# Patient Record
Sex: Male | Born: 1985 | Race: White | Hispanic: No | Marital: Single | Smoking: Current every day smoker
Health system: Southern US, Community
[De-identification: ages and names within clinical notes are randomized; demographics above are authoritative.]

## PROBLEM LIST (undated history)

## (undated) DIAGNOSIS — F1910 Other psychoactive substance abuse, uncomplicated: Secondary | ICD-10-CM

## (undated) DIAGNOSIS — H7290 Unspecified perforation of tympanic membrane, unspecified ear: Secondary | ICD-10-CM

## (undated) DIAGNOSIS — J45909 Unspecified asthma, uncomplicated: Secondary | ICD-10-CM

## (undated) HISTORY — DX: Unspecified asthma, uncomplicated: J45.909

## (undated) HISTORY — DX: Other psychoactive substance abuse, uncomplicated: F19.10

## (undated) HISTORY — PX: TONSILLECTOMY: SUR1361

---
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
## 2011-04-20 ENCOUNTER — Emergency Department (HOSPITAL_COMMUNITY)
Admission: EM | Admit: 2011-04-20 | Discharge: 2011-04-21 | Disposition: A | Discharge status: Other Acute Inpatient Hospital | Attending: Emergency Medicine | Admitting: Emergency Medicine

## 2011-04-20 ENCOUNTER — Ambulatory Visit (HOSPITAL_COMMUNITY)
Admission: RE | Admit: 2011-04-20 | Discharge: 2011-04-20 | Disposition: A | Discharge status: Home / Self Care | Source: Ambulatory Visit | Attending: Psychiatry | Admitting: Psychiatry

## 2011-04-20 DIAGNOSIS — IMO0002 Reserved for concepts with insufficient information to code with codable children: Secondary | ICD-10-CM | POA: Insufficient documentation

## 2011-04-20 DIAGNOSIS — R45850 Homicidal ideations: Secondary | ICD-10-CM | POA: Insufficient documentation

## 2011-04-20 DIAGNOSIS — F39 Unspecified mood [affective] disorder: Secondary | ICD-10-CM | POA: Insufficient documentation

## 2011-04-20 LAB — DIFFERENTIAL
Basophils Absolute: 0 10*3/uL (ref 0.0–0.1)
Basophils Relative: 0 % (ref 0–1)
Eosinophils Absolute: 0.1 10*3/uL (ref 0.0–0.7)
Eosinophils Relative: 1 % (ref 0–5)
Lymphocytes Relative: 17 % (ref 12–46)
Lymphs Abs: 1.9 10*3/uL (ref 0.7–4.0)
Monocytes Absolute: 0.8 10*3/uL (ref 0.1–1.0)
Monocytes Relative: 7 % (ref 3–12)
Neutro Abs: 8.5 10*3/uL — ABNORMAL HIGH (ref 1.7–7.7)
Neutrophils Relative %: 76 % (ref 43–77)

## 2011-04-20 LAB — RAPID URINE DRUG SCREEN, HOSP PERFORMED
Barbiturates: NOT DETECTED
Benzodiazepines: POSITIVE — AB

## 2011-04-20 LAB — COMPREHENSIVE METABOLIC PANEL
ALT: 24 U/L (ref 0–53)
Alkaline Phosphatase: 83 U/L (ref 39–117)
BUN: 8 mg/dL (ref 6–23)
CO2: 30 mEq/L (ref 19–32)
GFR calc Af Amer: >60 mL/min (ref >60)
GFR calc non Af Amer: >60 mL/min (ref >60)
Glucose, Bld: 101 mg/dL — ABNORMAL HIGH (ref 70–99)
Potassium: 4 mEq/L (ref 3.5–5.1)
Sodium: 140 mEq/L (ref 135–145)
Total Bilirubin: 0.2 mg/dL — ABNORMAL LOW (ref 0.3–1.2)
Total Protein: 7.4 g/dL (ref 6.0–8.3)

## 2011-04-20 LAB — CBC
HCT: 46.8 % (ref 39.0–52.0)
Hemoglobin: 15.5 g/dL (ref 13.0–17.0)
MCHC: 33.1 g/dL (ref 30.0–36.0)
RBC: 4.96 MIL/uL (ref 4.22–5.81)

## 2011-04-20 LAB — ETHANOL: Alcohol, Ethyl (B): <11 mg/dL (ref 0–11)

## 2016-05-19 ENCOUNTER — Encounter (HOSPITAL_COMMUNITY): Payer: Self-pay | Admitting: Emergency Medicine

## 2016-05-19 ENCOUNTER — Emergency Department (HOSPITAL_COMMUNITY)
Admission: EM | Admit: 2016-05-19 | Discharge: 2016-05-19 | Disposition: A | Discharge status: Home / Self Care | Attending: Emergency Medicine | Admitting: Emergency Medicine

## 2016-05-19 DIAGNOSIS — H6092 Unspecified otitis externa, left ear: Secondary | ICD-10-CM | POA: Insufficient documentation

## 2016-05-19 HISTORY — DX: Unspecified perforation of tympanic membrane, unspecified ear: H72.90

## 2016-05-19 MED ORDER — AMOXICILLIN 500 MG PO CAPS: 500.0000 mg | ORAL_CAPSULE | Freq: Three times a day (TID) | ORAL | 0 refills | Status: DC

## 2016-05-19 MED ORDER — NEOMYCIN-POLYMYXIN-HC 1 % OT SOLN
4.0000 [drp] | Freq: Once | OTIC | Status: AC
  Administered 2016-05-19: 4 [drp] via OTIC
  Filled 2016-05-19: qty 10

## 2016-05-19 MED ORDER — DICLOFENAC SODIUM 75 MG PO TBEC: 75.0000 mg | DELAYED_RELEASE_TABLET | Freq: Two times a day (BID) | ORAL | 0 refills | Status: DC

## 2016-05-19 MED ORDER — AMOXICILLIN 250 MG PO CAPS
500.0000 mg | ORAL_CAPSULE | Freq: Once | ORAL | Status: AC
  Administered 2016-05-19: 500 mg via ORAL
  Filled 2016-05-19: qty 2

## 2016-05-19 MED ORDER — KETOROLAC TROMETHAMINE 10 MG PO TABS
10.0000 mg | ORAL_TABLET | Freq: Once | ORAL | Status: AC
  Administered 2016-05-19: 10 mg via ORAL
  Filled 2016-05-19: qty 1

## 2016-05-19 NOTE — ED Triage Notes (Signed)
Pt reports left ear pain for the past week.  States a lot of wax buildup and decreased hearing.

## 2016-05-19 NOTE — ED Provider Notes (Signed)
[facility] DEPT Provider Note   CSN: [id number] Arrival date & time: 05/19/16  1044  By signing my name below, I, Shanna Cisco, attest that this documentation has been prepared under the direction and in the presence of Lily Kocher, PA-C. Electronically signed by: Shanna Cisco, ED Scribe. 05/19/16. 11:32 AM.  History   Chief Complaint Chief Complaint  Patient presents with  . Otalgia    left   The history is provided by the patient. No language interpreter was used.   HPI Comments:  Adrian Chandler is a 30 y.o. male who presents to the Emergency Department complaining of left ear pain, which started 5 days ago. Associated symptoms include wax buildup, drainage and decreased hearing. Pt reports that he has been cleaning ears every night with Q-tips. He has been cleaning ears with Q-tips. Denies fever.  Past Medical History:  Diagnosis Date  . Ruptured tympanic membrane     There are no active problems to display for this patient.   Past Surgical History:  Procedure Laterality Date  . TONSILLECTOMY        Home Medications    Prior to Admission medications   Not on File    Family History History reviewed. No pertinent family history.  Social History Social History  Substance Use Topics  . Smoking status: Never Smoker  . Smokeless tobacco: Never Used  . Alcohol use No     Allergies   Review of patient's allergies indicates no known allergies.   Review of Systems Review of Systems  Constitutional: Negative for fever.  HENT: Positive for ear discharge and ear pain.   All other systems reviewed and are negative.    Physical Exam Updated Vital Signs BP 114/79 (BP Location: Left Arm)   Pulse 63   Temp 97.8 F (36.6 C) (Oral)   Resp 18   Ht 5' 9"  (1.753 m)   Wt 145 lb (65.8 kg)   SpO2 100%   BMI 21.41 kg/m   Physical Exam  Constitutional: He is oriented to person, place, and time. He appears well-developed and well-nourished.  HENT:  Head:  Normocephalic and atraumatic.  Mouth/Throat: Uvula is midline and oropharynx is clear and moist. No oropharyngeal exudate.  Right external auditory canal is clear. Partial cerumen impaction. Portion of tympanic membrane seen is clear. No pre or post ericular nodes in right or left ear.  Increased redness and swelling of auditory canal. TM not visualized.  Eyes: Conjunctivae and EOM are normal. Pupils are equal, round, and reactive to light.  Neck: Normal range of motion.  Small palpable lymphnodes on left.  Cardiovascular: Normal rate, regular rhythm and normal heart sounds.   Pulmonary/Chest: Effort normal and breath sounds normal.  Abdominal: Soft. Bowel sounds are normal.  Musculoskeletal: Normal range of motion.  Neurological: He is alert and oriented to person, place, and time.  Skin: Skin is warm and dry.  Psychiatric: He has a normal mood and affect.  Nursing note and vitals reviewed.    ED Treatments / Results  DIAGNOSTIC STUDIES:  Oxygen Saturation is 100% on room air, normal by my interpretation.    COORDINATION OF CARE:  11:26 AM Discussed treatment plan with pt at bedside, which include oral and ear drop antibiotics, and pt agreed to plan. Advised to follow up with ENT if OTC ear wax removal kit does not provide relief.   Labs (all labs ordered are listed, but only abnormal results are displayed) Labs Reviewed - No data to display  EKG  EKG Interpretation None       Radiology No results found.  Procedures Procedures (including critical care time)  Medications Ordered in ED Medications - No data to display   Initial Impression / Assessment and Plan / ED Course  I have reviewed the triage vital signs and the nursing notes.  Pertinent labs & imaging results that were available during my care of the patient were reviewed by me and considered in my medical decision making (see chart for details).  Clinical Course    *I have reviewed nursing notes,  vital signs, and all appropriate lab and imaging results for this patient.**  Final Clinical Impressions(s) / ED Diagnoses  The vital signs within normal limits. There is swelling of the external auditory canal consistent with otitis externa. The tympanic membrane is not visualized adequately. Patient will be placed on both antibiotic ear drops, as well as oral medication.  Patient states she has frequent problems with earwax, uses Q-tips frequently. We discussed danger of using Q-tips as well as the possibility of causing external otitis problems. Patient is referred to ENT if she does not have success with ear wax removal kits at the pharmacy. Patient is in agreement with this discharge plan.    Final diagnoses:  Otitis externa, left    New Prescriptions New Prescriptions   No medications on file  **I personally performed the services described in this documentation, which was scribed in my presence. The recorded information has been reviewed and is accurate.Lily Kocher, PA-C 05/25/16 1926    Milton Ferguson, MD 05/27/16 [phone]

## 2016-05-19 NOTE — Discharge Instructions (Signed)
Your vital signs within normal limits. The canal from the outside of your right ear leading to the eardrum is swollen. Please use 4 drops of Cortisporin in the left ear 3 times daily for the next 5-7 days. Please use Amoxil 3 times daily with food, and diclofenac 2 times daily. Please see the ear specialist listed above if you continue to have problems with ear pain, or earwax issues that would not resolve with the earwax removal system from the drugstore.

## 2016-05-21 ENCOUNTER — Emergency Department (HOSPITAL_COMMUNITY)
Admission: EM | Admit: 2016-05-21 | Discharge: 2016-05-21 | Disposition: A | Discharge status: Home / Self Care | Attending: Physician Assistant | Admitting: Physician Assistant

## 2016-05-21 ENCOUNTER — Encounter (HOSPITAL_COMMUNITY): Payer: Self-pay | Admitting: Emergency Medicine

## 2016-05-21 DIAGNOSIS — H6505 Acute serous otitis media, recurrent, left ear: Secondary | ICD-10-CM

## 2016-05-21 MED ORDER — AMOXICILLIN-POT CLAVULANATE 875-125 MG PO TABS: 1.0000 | ORAL_TABLET | Freq: Two times a day (BID) | ORAL | 0 refills | Status: DC

## 2016-05-21 NOTE — ED Provider Notes (Signed)
[facility]-EMERGENCY DEPT Provider Note   CSN: [id number] Arrival date & time: 05/21/16  1120   By signing my name below, I, Adrian Chandler, attest that this documentation has been prepared under the direction and in the presence of Adrian Lower, NP. Electronically Signed: Sonum Chandler, Neurosurgeon. 05/21/16. 11:51 AM.   History   Chief Complaint Chief Complaint  Patient presents with  . Otalgia    The history is provided by the patient. No language interpreter was used.     HPI Comments: Adrian Chandler is a 30 y.o. male who presents to the Emergency Department complaining of persistent left sided ear pain that began 1 week ago. He states it initially started as itchiness and progressed to pain. He was seen in another ED and was started on amoxicillin and neomycin ear drops on 05/19/16 without relief. He has a history of a ruptured TM to the same ear. He denies fever.   Past Medical History:  Diagnosis Date  . Ruptured tympanic membrane     There are no active problems to display for this patient.   Past Surgical History:  Procedure Laterality Date  . TONSILLECTOMY         Home Medications    Prior to Admission medications   Medication Sig Start Date End Date Taking? Authorizing Provider  amoxicillin (AMOXIL) 500 MG capsule Take 1 capsule (500 mg total) by mouth 3 (three) times daily. 05/19/16   Ivery Quale, PA-C  diclofenac (VOLTAREN) 75 MG EC tablet Take 1 tablet (75 mg total) by mouth 2 (two) times daily. 05/19/16   Ivery Quale, PA-C    Family History No family history on file.  Social History Social History  Substance Use Topics  . Smoking status: Never Smoker  . Smokeless tobacco: Never Used  . Alcohol use No     Allergies   Review of patient's allergies indicates no known allergies.   Review of Systems Review of Systems  Constitutional: Negative for fever.  HENT: Positive for ear pain.   All other systems reviewed and are negative.    Physical  Exam Updated Vital Signs BP 141/77 (BP Location: Left Arm)   Pulse 68   Temp 97.9 F (36.6 C) (Oral)   Resp 18   Ht 5\' 9"  (1.753 m)   Wt 145 lb (65.8 kg)   SpO2 100%   BMI 21.41 kg/m   Physical Exam  Constitutional: He is oriented to person, place, and time. He appears well-developed and well-nourished. No distress.  HENT:  Head: Normocephalic and atraumatic.  Right Ear: External ear normal.  Left Ear: There is swelling and tenderness. Tympanic membrane is erythematous and bulging.  Mouth/Throat: Oropharynx is clear and moist.  Eyes: Conjunctivae and EOM are normal.  Neck: Neck supple. No tracheal deviation present.  Cardiovascular: Normal rate.   Pulmonary/Chest: Effort normal. No respiratory distress.  Musculoskeletal: Normal range of motion.  Neurological: He is alert and oriented to person, place, and time.  Skin: Skin is warm and dry.  Psychiatric: He has a normal mood and affect. His behavior is normal.  Nursing note and vitals reviewed.    ED Treatments / Results  DIAGNOSTIC STUDIES: Oxygen Saturation is 100% on RA, normal by my interpretation.    COORDINATION OF CARE: 11:56 AM Discussed treatment plan with pt at bedside and pt agreed to plan.   Labs (all labs ordered are listed, but only abnormal results are displayed) Labs Reviewed - No data to display  EKG  EKG Interpretation  None       Radiology No results found.  Procedures Procedures (including critical care time)  Medications Ordered in ED Medications - No data to display   Initial Impression / Assessment and Plan / ED Course  I have reviewed the triage vital signs and the nursing notes.  Pertinent labs & imaging results that were available during my care of the patient were reviewed by me and considered in my medical decision making (see chart for details).  Clinical Course    Will switch to augmentin.. Discussed supportive care at home with pt  Final Clinical Impressions(s) / ED  Diagnoses   Final diagnoses:  Recurrent acute serous otitis media of left ear    New Prescriptions New Prescriptions   No medications on file   I personally performed the services described in this documentation, which was scribed in my presence. The recorded information has been reviewed and is accurate.    Adrian LowerVrinda Malee Grays, NP 05/21/16 1208    Adrian Chandler Adrian Mackuen, MD 05/23/16 1053

## 2016-05-21 NOTE — ED Triage Notes (Signed)
Pt states four days ago he went to [facility], states "the inside of my left ear is itching, they gave him amoxicillin and ear drops and i've been taking them and its gotten worse."

## 2017-09-01 ENCOUNTER — Ambulatory Visit: Payer: Self-pay | Admitting: Family Medicine

## 2018-10-09 ENCOUNTER — Other Ambulatory Visit: Payer: Self-pay

## 2018-10-09 ENCOUNTER — Emergency Department (HOSPITAL_COMMUNITY)
Admission: EM | Admit: 2018-10-09 | Discharge: 2018-10-09 | Disposition: A | Discharge status: Home / Self Care | Payer: Self-pay | Attending: Emergency Medicine | Admitting: Emergency Medicine

## 2018-10-09 ENCOUNTER — Emergency Department (HOSPITAL_COMMUNITY): Payer: Self-pay

## 2018-10-09 ENCOUNTER — Encounter (HOSPITAL_COMMUNITY): Payer: Self-pay | Admitting: Emergency Medicine

## 2018-10-09 DIAGNOSIS — I959 Hypotension, unspecified: Secondary | ICD-10-CM | POA: Insufficient documentation

## 2018-10-09 DIAGNOSIS — S0990XA Unspecified injury of head, initial encounter: Secondary | ICD-10-CM

## 2018-10-09 DIAGNOSIS — W19XXXA Unspecified fall, initial encounter: Secondary | ICD-10-CM | POA: Insufficient documentation

## 2018-10-09 DIAGNOSIS — Y92511 Restaurant or cafe as the place of occurrence of the external cause: Secondary | ICD-10-CM | POA: Insufficient documentation

## 2018-10-09 DIAGNOSIS — Z23 Encounter for immunization: Secondary | ICD-10-CM | POA: Insufficient documentation

## 2018-10-09 DIAGNOSIS — S0181XA Laceration without foreign body of other part of head, initial encounter: Secondary | ICD-10-CM | POA: Insufficient documentation

## 2018-10-09 DIAGNOSIS — Y999 Unspecified external cause status: Secondary | ICD-10-CM | POA: Insufficient documentation

## 2018-10-09 DIAGNOSIS — Y9389 Activity, other specified: Secondary | ICD-10-CM | POA: Insufficient documentation

## 2018-10-09 DIAGNOSIS — R55 Syncope and collapse: Secondary | ICD-10-CM | POA: Insufficient documentation

## 2018-10-09 LAB — URINALYSIS, ROUTINE W REFLEX MICROSCOPIC
Bilirubin Urine: NEGATIVE
Glucose, UA: NEGATIVE mg/dL
Hgb urine dipstick: NEGATIVE
Ketones, ur: NEGATIVE mg/dL
Leukocytes, UA: NEGATIVE
NITRITE: NEGATIVE
Protein, ur: NEGATIVE mg/dL
SPECIFIC GRAVITY, URINE: 1.024 (ref 1.005–1.030)
pH: 6 (ref 5.0–8.0)

## 2018-10-09 LAB — CBG MONITORING, ED: GLUCOSE-CAPILLARY: 126 mg/dL — AB (ref 70–99)

## 2018-10-09 LAB — CBC
HCT: 42.6 % (ref 39.0–52.0)
HEMOGLOBIN: 13.8 g/dL (ref 13.0–17.0)
MCH: 33.5 pg (ref 26.0–34.0)
MCHC: 32.4 g/dL (ref 30.0–36.0)
MCV: 103.4 fL — ABNORMAL HIGH (ref 80.0–100.0)
Platelets: 293 10*3/uL (ref 150–400)
RBC: 4.12 MIL/uL — AB (ref 4.22–5.81)
RDW: 13.6 % (ref 11.5–15.5)
WBC: 7 10*3/uL (ref 4.0–10.5)
nRBC: 0 % (ref 0.0–0.2)

## 2018-10-09 LAB — BASIC METABOLIC PANEL
ANION GAP: 11 (ref 5–15)
BUN: 14 mg/dL (ref 6–20)
CHLORIDE: 102 mmol/L (ref 98–111)
CO2: 22 mmol/L (ref 22–32)
Calcium: 8.6 mg/dL — ABNORMAL LOW (ref 8.9–10.3)
Creatinine, Ser: 0.93 mg/dL (ref 0.61–1.24)
GFR calc Af Amer: >60 mL/min (ref >60)
GFR calc non Af Amer: >60 mL/min (ref >60)
GLUCOSE: 137 mg/dL — AB (ref 70–99)
POTASSIUM: 3.7 mmol/L (ref 3.5–5.1)
SODIUM: 135 mmol/L (ref 135–145)

## 2018-10-09 LAB — TROPONIN I

## 2018-10-09 MED ORDER — SODIUM CHLORIDE 0.9% FLUSH
3.0000 mL | Freq: Once | INTRAVENOUS | Status: AC
  Administered 2018-10-09: 3 mL via INTRAVENOUS

## 2018-10-09 MED ORDER — SODIUM CHLORIDE 0.9 % IV BOLUS
1000.0000 mL | Freq: Once | INTRAVENOUS | Status: AC
  Administered 2018-10-09: 1000 mL via INTRAVENOUS

## 2018-10-09 MED ORDER — LIDOCAINE HCL 2 % IJ SOLN
10.0000 mL | Freq: Once | INTRAMUSCULAR | Status: AC
  Administered 2018-10-09: 200 mg
  Filled 2018-10-09: qty 20

## 2018-10-09 MED ORDER — TETANUS-DIPHTH-ACELL PERTUSSIS 5-2.5-18.5 LF-MCG/0.5 IM SUSP
0.5000 mL | Freq: Once | INTRAMUSCULAR | Status: AC
  Administered 2018-10-09: 0.5 mL via INTRAMUSCULAR
  Filled 2018-10-09: qty 0.5

## 2018-10-09 NOTE — ED Triage Notes (Addendum)
Patient reports syncopal episode at restaurant. Approximately three inch laceration to right forehead. Bleeding controlled. Denies blood thinners. Denies chest pain and SOB. Reports "not eating since breakfast" and drinking orange juice post syncopal episode and "feeling better."

## 2018-10-09 NOTE — ED Notes (Signed)
Patient transported to X-ray & CT °

## 2018-10-09 NOTE — ED Provider Notes (Signed)
[facility]-EMERGENCY DEPT Provider Note   CSN: [id number] Arrival date & time: 10/09/18  1906     History   Chief Complaint Chief Complaint  Patient presents with  . Loss of Consciousness  . Laceration    HPI STPEHEN TROJANOWSKI is a 33 y.o. male.  HPI Patient was at the steakhouse up to the salad bar when he began to feel lightheaded.  Then he passed out.  Larey Seat forward striking his head.  Has had other episodes where he stood up and felt lightheaded.  No chest pain.  No trouble breathing.  Did have a loss consciousness but unsure if is before or after he has had.  No neck pain.  No extremity pain.  He is been doing well the last couple days.  Did not eat any lunch today however.  States he drank some orange juice after the event happened.  Last tetanus is greater than 10 years ago.  Laceration to right side of forehead. Past Medical History:  Diagnosis Date  . Ruptured tympanic membrane     There are no active problems to display for this patient.   Past Surgical History:  Procedure Laterality Date  . TONSILLECTOMY          Home Medications    Prior to Admission medications   Medication Sig Start Date End Date Taking? Authorizing Provider  amoxicillin (AMOXIL) 500 MG capsule Take 1 capsule (500 mg total) by mouth 3 (three) times daily. Patient not taking: Reported on 10/09/2018 05/19/16   Ivery Quale, PA-C  amoxicillin-clavulanate (AUGMENTIN) 875-125 MG tablet Take 1 tablet by mouth every 12 (twelve) hours. Patient not taking: Reported on 10/09/2018 05/21/16   Teressa Lower, NP  diclofenac (VOLTAREN) 75 MG EC tablet Take 1 tablet (75 mg total) by mouth 2 (two) times daily. Patient not taking: Reported on 10/09/2018 05/19/16   Ivery Quale, PA-C    Family History No family history on file.  Social History Social History   Tobacco Use  . Smoking status: Never Smoker  . Smokeless tobacco: Never Used  Substance Use Topics  . Alcohol use: No    . Drug use: No     Allergies   Patient has no known allergies.   Review of Systems Review of Systems  Constitutional: Negative for appetite change.  HENT: Negative for hearing loss and nosebleeds.   Eyes: Negative for visual disturbance.  Cardiovascular: Negative for chest pain.  Genitourinary: Negative for flank pain.  Musculoskeletal: Negative for gait problem.  Skin: Negative for rash.  Neurological: Positive for syncope.  Hematological: Negative for adenopathy.  Psychiatric/Behavioral: Negative for confusion.     Physical Exam Updated Vital Signs BP 104/64   Pulse 76   Temp 98 F (36.7 C) (Oral)   Resp 18   Ht 5\' 9"  (1.753 m)   Wt 65.8 kg   SpO2 99%   BMI 21.41 kg/m   Physical Exam HENT:     Head:     Comments: Vertical laceration along forehead around 6 cm longgoes from about 1 cm below the "damaged" tattoo on his forehead into his right eyebrow.    Nose: No rhinorrhea.     Comments: Mild tenderness over bridge of nose without deformity. Eyes:     Extraocular Movements: Extraocular movements intact.  Neck:     Musculoskeletal: Neck supple.  Cardiovascular:     Rate and Rhythm: Normal rate and regular rhythm.  Pulmonary:     Effort: Pulmonary effort is normal.  Abdominal:     Palpations: Abdomen is soft.  Musculoskeletal:     Right lower leg: No edema.     Left lower leg: No edema.  Skin:    General: Skin is warm.     Capillary Refill: Capillary refill takes less than 2 seconds.     Coloration: Skin is not pale.  Neurological:     Mental Status: He is alert and oriented to person, place, and time.  Psychiatric:        Mood and Affect: Mood normal.      ED Treatments / Results  Labs (all labs ordered are listed, but only abnormal results are displayed) Labs Reviewed  BASIC METABOLIC PANEL - Abnormal; Notable for the following components:      Result Value   Glucose, Bld 137 (*)    Calcium 8.6 (*)    All other components within normal  limits  CBC - Abnormal; Notable for the following components:   RBC 4.12 (*)    MCV 103.4 (*)    All other components within normal limits  URINALYSIS, ROUTINE W REFLEX MICROSCOPIC - Abnormal; Notable for the following components:   APPearance HAZY (*)    All other components within normal limits  CBG MONITORING, ED - Abnormal; Notable for the following components:   Glucose-Capillary 126 (*)    All other components within normal limits  TROPONIN I    EKG EKG Interpretation  Date/Time:  Saturday October 09 2018 19:17:33 EST Ventricular Rate:  73 PR Interval:    QRS Duration: 96 QT Interval:  385 QTC Calculation: 425 R Axis:   96 Text Interpretation:  Sinus arrhythmia Biatrial enlargement Consider right ventricular hypertrophy Reconfirmed by Benjiman Core [phone]) on 10/09/2018 9:25:00 PM   Radiology Dg Chest 2 View  Result Date: 10/09/2018 CLINICAL DATA:  Syncopal episodes EXAM: CHEST - 2 VIEW COMPARISON:  None. FINDINGS: The heart size and mediastinal contours are within normal limits. Both lungs are clear. The visualized skeletal structures are unremarkable. IMPRESSION: No active cardiopulmonary disease. Electronically Signed   By: Alcide Clever M.D.   On: 10/09/2018 20:04   Ct Head Wo Contrast  Result Date: 10/09/2018 CLINICAL DATA:  Syncopal episode with head injury, initial encounter EXAM: CT HEAD WITHOUT CONTRAST TECHNIQUE: Contiguous axial images were obtained from the base of the skull through the vertex without intravenous contrast. COMPARISON:  None. FINDINGS: Brain: No evidence of acute infarction, hemorrhage, hydrocephalus, extra-axial collection or mass lesion/mass effect. Vascular: No hyperdense vessel or unexpected calcification. Skull: Normal. Negative for fracture or focal lesion. Sinuses/Orbits: Stable mucosal thickening in the right ethmoid sinuses is noted. The orbits are within normal limits. Other: Soft tissue laceration in the right forehead is noted.  IMPRESSION: Mild mucosal changes in the right ethmoid sinuses. No acute intracranial abnormality noted. Mild laceration in the right forehead is noted. Electronically Signed   By: Alcide Clever M.D.   On: 10/09/2018 20:08    Procedures .Marland KitchenLaceration Repair Date/Time: 10/09/2018 9:25 PM Performed by: Benjiman Core, MD Authorized by: Benjiman Core, MD   Consent:    Consent obtained:  Verbal   Consent given by:  Patient   Risks discussed:  Infection, pain, poor cosmetic result, poor wound healing, nerve damage and need for additional repair   Alternatives discussed:  No treatment and delayed treatment Anesthesia (see MAR for exact dosages):    Anesthesia method:  Nerve block   Block location:  Right supraorbital   Block needle gauge:  25 G  Block anesthetic:  Lidocaine 2% w/o epi   Block injection procedure:  Anatomic landmarks identified, introduced needle, incremental injection, anatomic landmarks palpated and negative aspiration for blood   Block outcome:  Anesthesia achieved Laceration details:    Location:  Face   Face location:  Forehead   Length (cm):  6 Repair type:    Repair type:  Intermediate Pre-procedure details:    Preparation:  Patient was prepped and draped in usual sterile fashion Exploration:    Wound exploration: wound explored through full range of motion     Wound extent: no foreign bodies/material noted     Contaminated: no   Treatment:    Area cleansed with:  Hibiclens   Amount of cleaning:  Standard Subcutaneous repair:    Suture size:  5-0   Suture material:  Vicryl   Suture technique:  Simple interrupted   Number of sutures:  2 Skin repair:    Repair method:  Sutures   Suture size:  5-0   Suture material:  Prolene   Suture technique:  Running   Number of sutures:  16 Approximation:    Approximation:  Close Post-procedure details:    Dressing:  Open (no dressing)   Patient tolerance of procedure:  Tolerated well, no immediate  complications   (including critical care time)  Medications Ordered in ED Medications  sodium chloride flush (NS) 0.9 % injection 3 mL (3 mLs Intravenous Given 10/09/18 1947)  sodium chloride 0.9 % bolus 1,000 mL (0 mLs Intravenous Stopped 10/09/18 2108)  Tdap (BOOSTRIX) injection 0.5 mL (0.5 mLs Intramuscular Given 10/09/18 2015)  lidocaine (XYLOCAINE) 2 % (with pres) injection 200 mg (200 mg Infiltration Given 10/09/18 2015)     Initial Impression / Assessment and Plan / ED Course  I have reviewed the triage vital signs and the nursing notes.  Pertinent labs & imaging results that were available during my care of the patient were reviewed by me and considered in my medical decision making (see chart for details).     Patient with syncopal episode.  Sounds somewhat orthostatic or related to not eating.  Has had episode where he feels bad when he stands up.  Initially hypotensive but feels better with IV fluid.  Head CT done and reassuring.  EKG reassuring but has nonspecific changes.  Patient instructed on need to follow-up with primary care doctor.  Wound closed.  Sutures out around 7 days.  Discharge home.  Final Clinical Impressions(s) / ED Diagnoses   Final diagnoses:  Injury of head, initial encounter  Facial laceration, initial encounter  Syncope, unspecified syncope type    ED Discharge Orders    None       Benjiman CorePickering, Loudon Krakow, MD 10/09/18 2127

## 2018-10-09 NOTE — Discharge Instructions (Addendum)
Have the stitches taken out in around 7 days.  Follow-up with a primary care doctor for further evaluation of the passing out.

## 2019-07-23 IMAGING — CT CT HEAD W/O CM
2 of 3 series · 13 of 47 positions shown, 16 images · non-contrast
Comparison: None.

CLINICAL DATA: Syncopal episode with head injury, initial encounter

EXAM:
CT HEAD WITHOUT CONTRAST
TECHNIQUE: Contiguous axial images were obtained from the base of the skull
through the vertex without intravenous contrast.

[Series 3: head wo · axial · 0.53mm/px · z∈[+305,+430]mm · 10 of 31 slices shown, 13 images]
[im 3/31  brain]
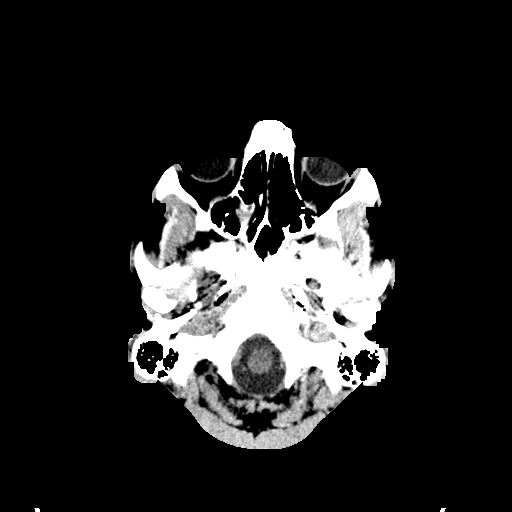
[im 3/31  bone]
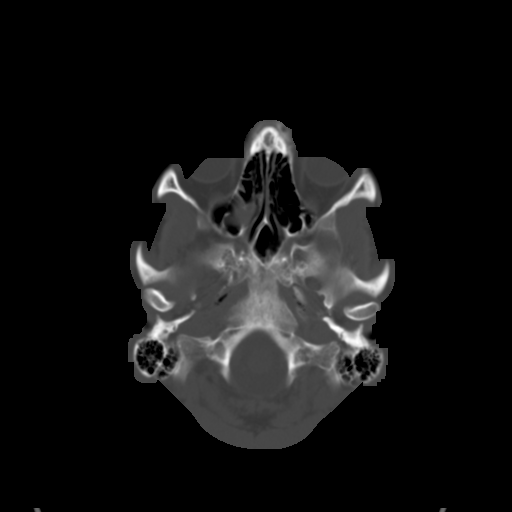
[im 6/31  brain]
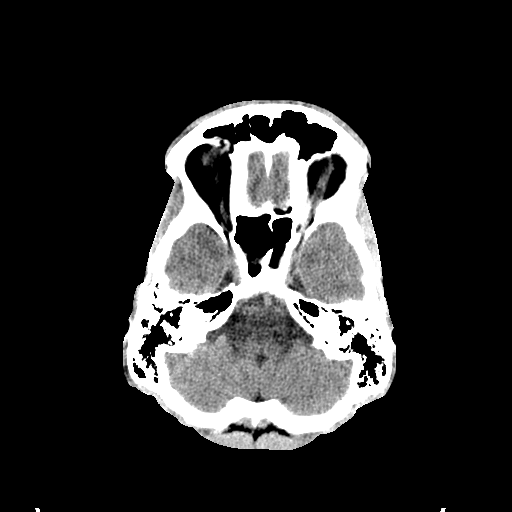
[im 9/31  brain]
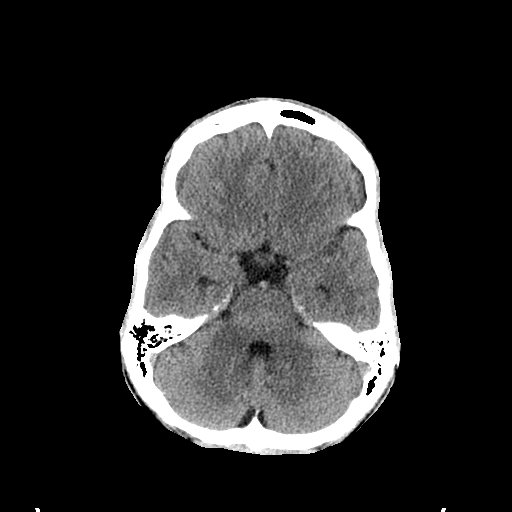
[im 11/31  brain]
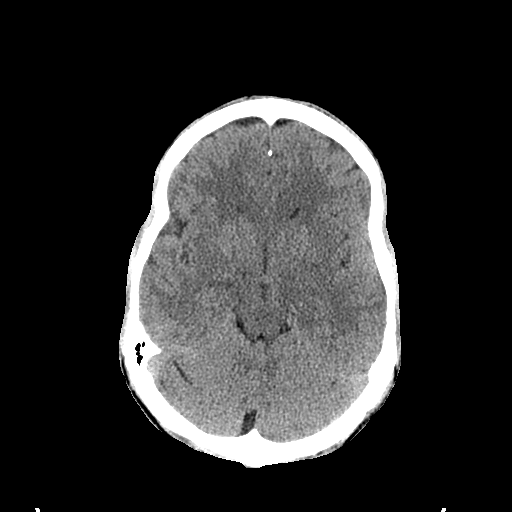
[im 14/31  brain]
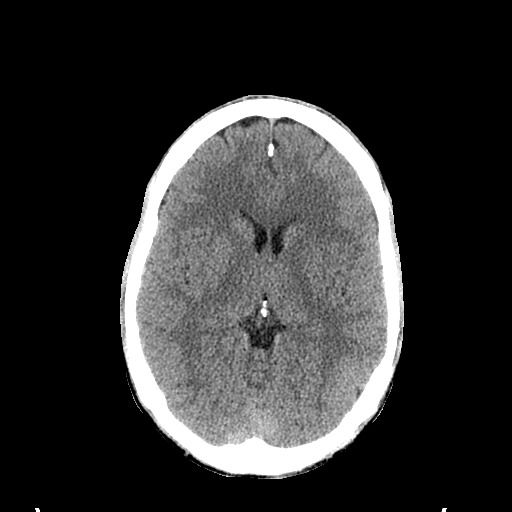
[im 14/31  bone]
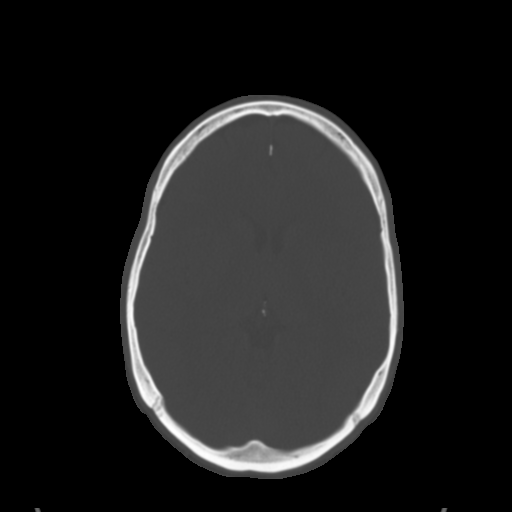
[im 17/31  brain]
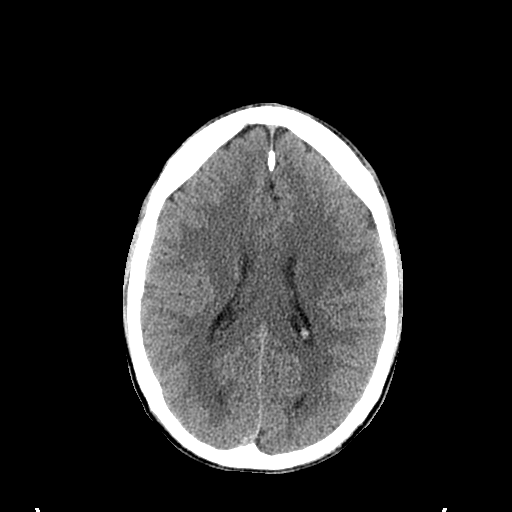
[im 20/31  brain]
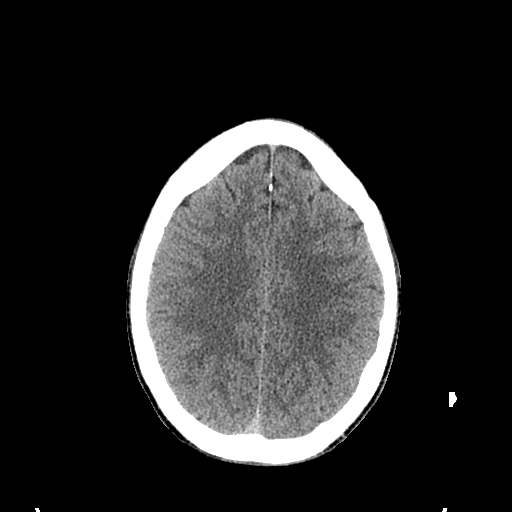
[im 23/31  brain]
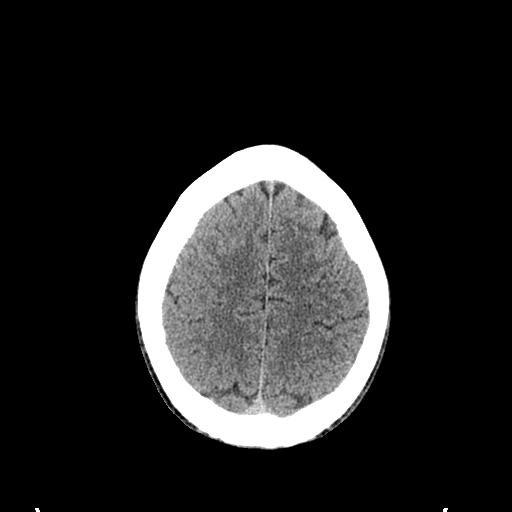
[im 25/31  brain]
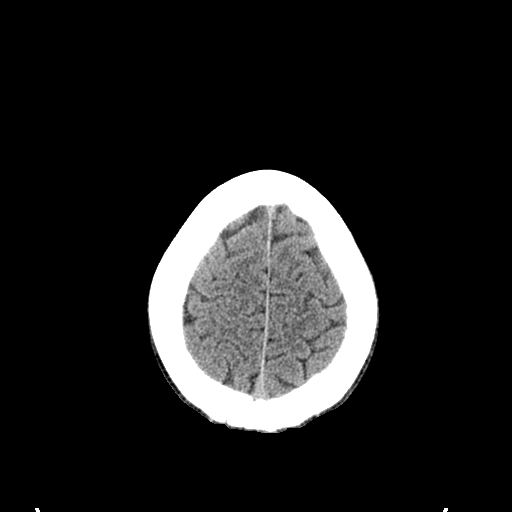
[im 25/31  bone]
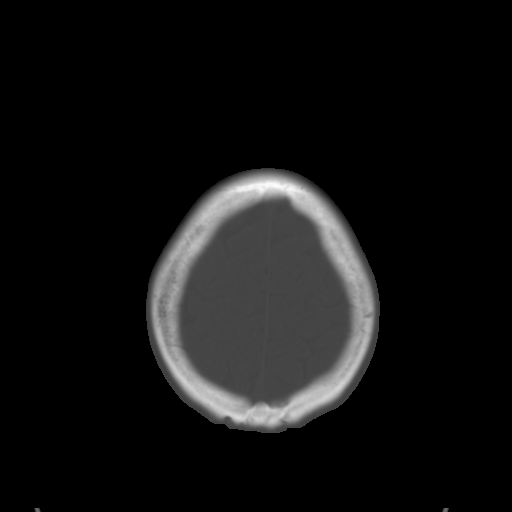
[im 28/31  brain]
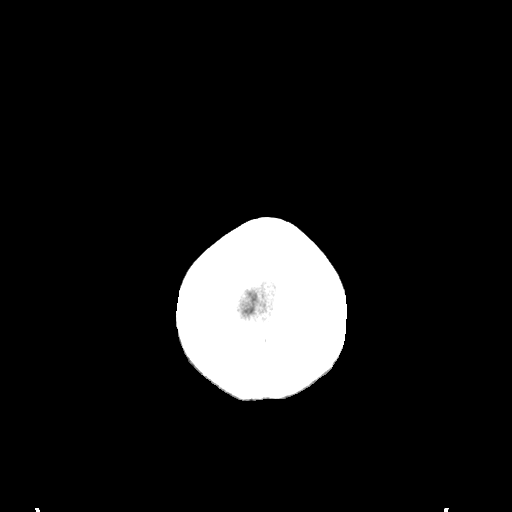

[Series 6: coronal soft tissue · coronal · 0.30mm/px · 3 of 72 slices shown]
[im 24/72  brain]
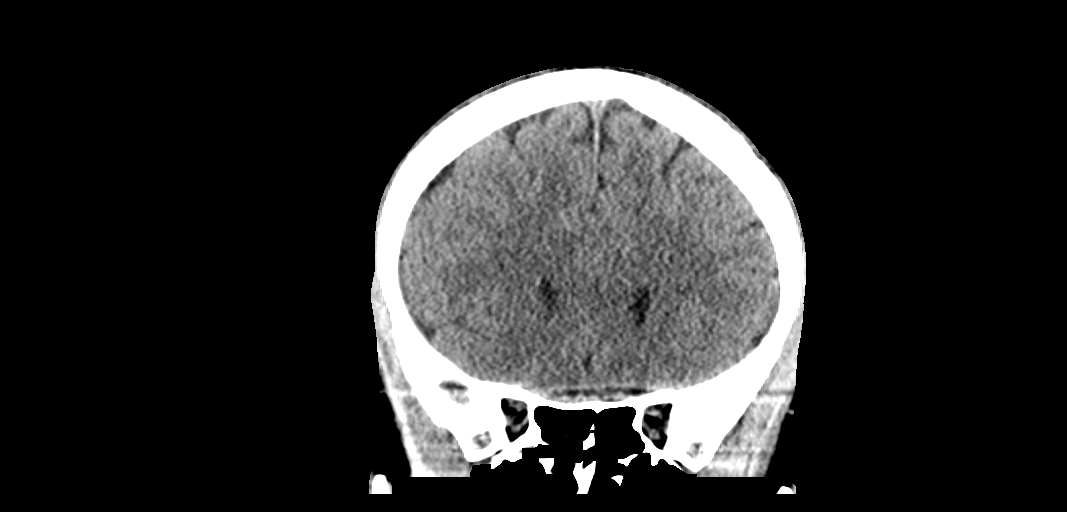
[im 32/72  brain]
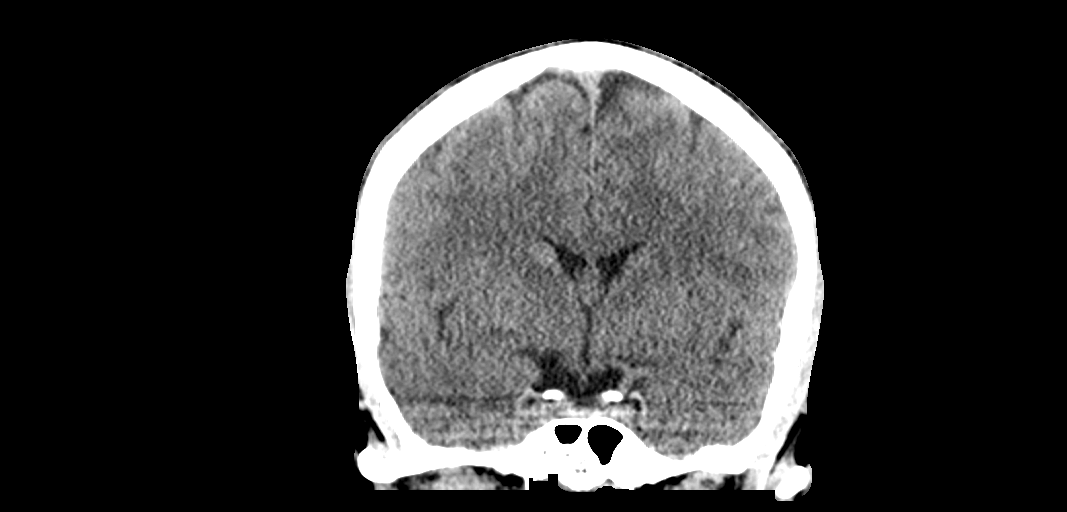
[im 40/72  brain]
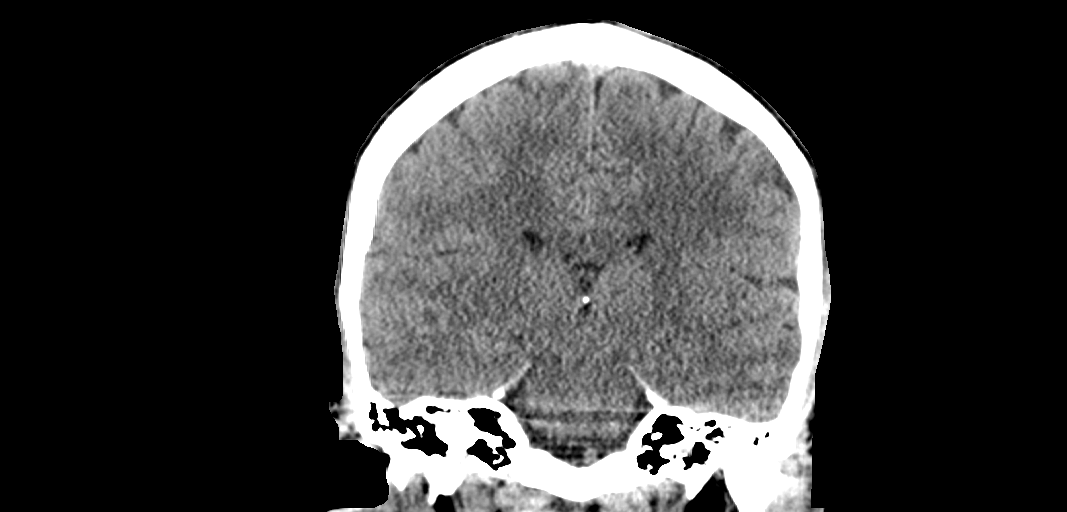

[13 of 47 positions shown; findings below may reference images not displayed]

FINDINGS: Brain: No evidence of acute infarction, hemorrhage, hydrocephalus,
extra-axial collection or mass lesion/mass effect.

Vascular: No hyperdense vessel or unexpected calcification.

Skull: Normal. Negative for fracture or focal lesion.

Sinuses/Orbits: Stable mucosal thickening in the right ethmoid
sinuses is noted. The orbits are within normal limits.

Other: Soft tissue laceration in the right forehead is noted.
IMPRESSION: Mild mucosal changes in the right ethmoid sinuses. No acute
intracranial abnormality noted.

Mild laceration in the right forehead is noted.

## 2020-10-06 ENCOUNTER — Other Ambulatory Visit: Payer: Self-pay

## 2020-10-06 ENCOUNTER — Encounter: Payer: Self-pay | Admitting: Emergency Medicine

## 2020-10-06 ENCOUNTER — Ambulatory Visit
Admission: EM | Admit: 2020-10-06 | Discharge: 2020-10-06 | Disposition: A | Discharge status: Home / Self Care | Payer: Self-pay | Attending: Family Medicine | Admitting: Family Medicine

## 2020-10-06 DIAGNOSIS — B001 Herpesviral vesicular dermatitis: Secondary | ICD-10-CM

## 2020-10-06 MED ORDER — VALACYCLOVIR HCL 1 G PO TABS: 1000.0000 mg | ORAL_TABLET | Freq: Three times a day (TID) | ORAL | 0 refills | Status: AC

## 2020-10-06 NOTE — ED Provider Notes (Signed)
[facility]   [id number] 10/06/20 Arrival Time: 1003  CC: RASH  SUBJECTIVE:  Adrian Chandler is a 35 y.o. male who presents with a skin complaint that began x 2 weeks ago. Reports that he has a fever blister to L upper lip. Reports that the first blister showed up 2 weeks ago, and he has a second blister that began to appear Denies precipitating event or trauma. Denies changes in soaps, detergents, close contacts with similar rash, known trigger or environmental trigger, allergy. Denies medications change or starting a new medication recently. Denies fever, chills, nausea, vomiting, erythema, swelling, discharge, SOB, chest pain, abdominal pain, changes in bowel or bladder function.    ROS: As per HPI.  All other pertinent ROS negative.     Past Medical History:  Diagnosis Date  . Ruptured tympanic membrane    Past Surgical History:  Procedure Laterality Date  . TONSILLECTOMY     No Known Allergies No current facility-administered medications on file prior to encounter.   Current Outpatient Medications on File Prior to Encounter  Medication Sig Dispense Refill  . amoxicillin (AMOXIL) 500 MG capsule Take 1 capsule (500 mg total) by mouth 3 (three) times daily. (Patient not taking: Reported on 10/09/2018) 21 capsule 0  . amoxicillin-clavulanate (AUGMENTIN) 875-125 MG tablet Take 1 tablet by mouth every 12 (twelve) hours. (Patient not taking: Reported on 10/09/2018) 14 tablet 0  . diclofenac (VOLTAREN) 75 MG EC tablet Take 1 tablet (75 mg total) by mouth 2 (two) times daily. (Patient not taking: Reported on 10/09/2018) 14 tablet 0   Social History   Socioeconomic History  . Marital status: Single    Spouse name: Not on file  . Number of children: Not on file  . Years of education: Not on file  . Highest education level: Not on file  Occupational History  . Not on file  Tobacco Use  . Smoking status: Never Smoker  . Smokeless tobacco: Never Used  Vaping Use  .  Vaping Use: Not on file  Substance and Sexual Activity  . Alcohol use: No  . Drug use: No  . Sexual activity: Not on file  Other Topics Concern  . Not on file  Social History Narrative  . Not on file   Social Determinants of Health   Financial Resource Strain: Not on file  Food Insecurity: Not on file  Transportation Needs: Not on file  Physical Activity: Not on file  Stress: Not on file  Social Connections: Not on file  Intimate Partner Violence: Not on file   History reviewed. No pertinent family history.  OBJECTIVE: Vitals:   10/06/20 1014  BP: 126/76  Pulse: 63  Resp: 16  Temp: 97.6 F (36.4 C)  TempSrc: Oral  SpO2: 99%  Weight: 145 lb (65.8 kg)  Height: 5\' 9"  (1.753 m)    General appearance: alert; no distress Head: NCAT Lungs: clear to auscultation bilaterally Heart: regular rate and rhythm.  Radial pulse 2+ bilaterally Extremities: no edema Skin: warm and dry; erythematous vesicular lesion to L upper lip Psychological: alert and cooperative; normal mood and affect  ASSESSMENT & PLAN:  1. Fever blister     Meds ordered this encounter  Medications  . valACYclovir (VALTREX) 1000 MG tablet    Sig: Take 1 tablet (1,000 mg total) by mouth 3 (three) times daily for 7 days.    Dispense:  21 tablet    Refill:  0    Order Specific Question:   Supervising  Provider    Answer:   Merrilee Jansky [[id number]]   Prescribed valtrex TID x 7 days Take as prescribed and to completion Avoid hot showers/ baths Moisturize skin daily  Follow up with PCP if symptoms persists Return or go to the ER if you have any new or worsening symptoms such as fever, chills, nausea, vomiting, redness, swelling, discharge, if symptoms do not improve with medications  Reviewed expectations re: course of current medical issues. Questions answered. Outlined signs and symptoms indicating need for more acute intervention. Patient verbalized understanding. After Visit Summary given.    Moshe Cipro, NP 10/08/20 1102

## 2020-10-06 NOTE — Discharge Instructions (Addendum)
I have sent in Valtrex for you to take three times a day for 7 days  Follow up with this office or with primary care if symptoms are persisting.  Follow up in the ER for high fever, trouble swallowing, trouble breathing, other concerning symptoms.

## 2020-10-06 NOTE — ED Triage Notes (Signed)
Fever blister x 2 weeks.

## 2020-10-25 ENCOUNTER — Encounter: Payer: Self-pay | Admitting: Emergency Medicine

## 2021-03-22 DIAGNOSIS — H60312 Diffuse otitis externa, left ear: Secondary | ICD-10-CM | POA: Diagnosis not present

## 2021-03-22 DIAGNOSIS — Z6820 Body mass index (BMI) 20.0-20.9, adult: Secondary | ICD-10-CM | POA: Diagnosis not present

## 2021-03-23 ENCOUNTER — Other Ambulatory Visit: Payer: Self-pay

## 2021-03-23 ENCOUNTER — Encounter (HOSPITAL_COMMUNITY): Payer: Self-pay | Admitting: *Deleted

## 2021-03-23 ENCOUNTER — Emergency Department (HOSPITAL_COMMUNITY)
Admission: EM | Admit: 2021-03-23 | Discharge: 2021-03-23 | Disposition: A | Discharge status: Home / Self Care | Payer: BC Managed Care – PPO | Attending: Emergency Medicine | Admitting: Emergency Medicine

## 2021-03-23 DIAGNOSIS — H60332 Swimmer's ear, left ear: Secondary | ICD-10-CM | POA: Insufficient documentation

## 2021-03-23 DIAGNOSIS — H9202 Otalgia, left ear: Secondary | ICD-10-CM | POA: Diagnosis not present

## 2021-03-23 MED ORDER — KETOROLAC TROMETHAMINE 60 MG/2ML IM SOLN
60.0000 mg | Freq: Once | INTRAMUSCULAR | Status: AC
  Administered 2021-03-23: 60 mg via INTRAMUSCULAR
  Filled 2021-03-23: qty 2

## 2021-03-23 NOTE — ED Provider Notes (Signed)
[facility] EMERGENCY DEPARTMENT Provider Note   CSN: [id number] Arrival date & time: 03/23/21  1411     History Chief Complaint  Patient presents with   Otalgia    Left ear    Adrian Chandler is a 35 y.o. male.   Otalgia Associated symptoms: no fever     Pt is a 35 y/o male -presenting with left-sided ear pain for several days, [place] the beach, started having pain several days ago, went to the urgent care yesterday and diagnosed with an ear infection given Augmentin, Polytrim drops and ibuprofen.  States the pain kept him up all night and he feels a little more swollen.  He has not measured his temperature but measures 98.4 here with a heart rate of 68.  Symptoms are persistent, worse with touching the ear, worse with opening and closing the mouth, no dental tenderness  Past Medical History:  Diagnosis Date   Ruptured tympanic membrane     There are no problems to display for this patient.   Past Surgical History:  Procedure Laterality Date   TONSILLECTOMY         History reviewed. No pertinent family history.  Social History   Tobacco Use   Smoking status: Never   Smokeless tobacco: Never  Substance Use Topics   Alcohol use: No   Drug use: No    Home Medications Prior to Admission medications   Medication Sig Start Date End Date Taking? Authorizing Provider  amoxicillin (AMOXIL) 500 MG capsule Take 1 capsule (500 mg total) by mouth 3 (three) times daily. Patient not taking: Reported on 10/09/2018 05/19/16   Ivery Quale, PA-C  amoxicillin-clavulanate (AUGMENTIN) 875-125 MG tablet Take 1 tablet by mouth every 12 (twelve) hours. Patient not taking: Reported on 10/09/2018 05/21/16   Teressa Lower, NP  diclofenac (VOLTAREN) 75 MG EC tablet Take 1 tablet (75 mg total) by mouth 2 (two) times daily. Patient not taking: Reported on 10/09/2018 05/19/16   Ivery Quale, PA-C    Allergies    Patient has no known allergies.  Review of Systems   Review  of Systems  Constitutional:  Negative for fever.  HENT:  Positive for ear pain.    Physical Exam Updated Vital Signs BP 114/74 (BP Location: Left Arm)   Pulse 68   Temp 98.4 F (36.9 C) (Oral)   Resp 20   Ht 1.753 m (5\' 9" )   Wt 65.8 kg   SpO2 99%   BMI 21.41 kg/m   Physical Exam Vitals and nursing note reviewed.  Constitutional:      Appearance: He is well-developed. He is not diaphoretic.  HENT:     Head: Normocephalic and atraumatic.     Ears:     Comments: Right ear is normal, tympanic membrane is visualized, left ear has swelling of the external auditory canal, tenderness with manipulation of the auricle and tragus, I am unable to insert the speculum into the ear canal because of swelling.    Mouth/Throat:     Mouth: Mucous membranes are moist.     Comments: No dental injuries or obvious dental infections, no trismus or torticollis Eyes:     General:        Right eye: No discharge.        Left eye: No discharge.     Conjunctiva/sclera: Conjunctivae normal.  Neck:     Comments: Shotty lymphadenopathy of the left anterior neck, very supple neck Pulmonary:     Effort: Pulmonary effort  is normal. No respiratory distress.  Skin:    General: Skin is warm and dry.     Findings: No erythema or rash.  Neurological:     Mental Status: He is alert.     Coordination: Coordination normal.    ED Results / Procedures / Treatments   Labs (all labs ordered are listed, but only abnormal results are displayed) Labs Reviewed - No data to display  EKG None  Radiology No results found.  Procedures Procedures   Medications Ordered in ED Medications  ketorolac (TORADOL) injection 60 mg (has no administration in time range)    ED Course  I have reviewed the triage vital signs and the nursing notes.  Pertinent labs & imaging results that were available during my care of the patient were reviewed by me and considered in my medical decision making (see chart for  details).    MDM Rules/Calculators/A&P                          The patient likely needs an ear wick, he has complaints of increasing pain and wants to have narcotic medications, I told him that we would not be prescribing opiate medications for his earache, he already has ibuprofen, he does needs a better delivery method of the antibiotic  I placed ear wick in L canal prior to d/c.  Toradol given  Stable for d;c No ttp over the mastoid - no signs of malignant OE>  Final Clinical Impression(s) / ED Diagnoses Final diagnoses:  Acute swimmer's ear of left side    Rx / DC Orders ED Discharge Orders     None        Eber Hong, MD 03/23/21 1513

## 2021-03-23 NOTE — ED Triage Notes (Signed)
Pt c/o left ear pain x 2 days; pt states he is unable to eat; pt was seen at urgent care yesterday and they gave him antibiotics and a injection of pain meds

## 2021-03-23 NOTE — Discharge Instructions (Addendum)
Continue the medications that they gave you, when you use the drop make sure that you fill up the entire ear canal with a drop, you should be doing this at least 4 times per day.  Continue the ibuprofen 3 times daily, the Augmentin or the oral antibiotic should continue to be taken twice a day until the medication is completed  Emergency department for severe or worsening swelling fevers.  See your family doctor within 2 days for a recheck to make sure things are getting better.  The ear wick will come out by itself once the ear canal swelling goes down

## 2021-03-26 DIAGNOSIS — H9313 Tinnitus, bilateral: Secondary | ICD-10-CM | POA: Insufficient documentation

## 2021-03-26 DIAGNOSIS — H60332 Swimmer's ear, left ear: Secondary | ICD-10-CM | POA: Diagnosis not present

## 2021-03-26 DIAGNOSIS — H9193 Unspecified hearing loss, bilateral: Secondary | ICD-10-CM | POA: Insufficient documentation

## 2021-04-01 DIAGNOSIS — R6883 Chills (without fever): Secondary | ICD-10-CM | POA: Diagnosis not present

## 2021-04-01 DIAGNOSIS — R5383 Other fatigue: Secondary | ICD-10-CM | POA: Diagnosis not present

## 2021-04-01 DIAGNOSIS — R52 Pain, unspecified: Secondary | ICD-10-CM | POA: Diagnosis not present

## 2021-04-01 DIAGNOSIS — H6501 Acute serous otitis media, right ear: Secondary | ICD-10-CM | POA: Diagnosis not present

## 2021-04-01 DIAGNOSIS — Z6820 Body mass index (BMI) 20.0-20.9, adult: Secondary | ICD-10-CM | POA: Diagnosis not present

## 2021-04-01 DIAGNOSIS — J329 Chronic sinusitis, unspecified: Secondary | ICD-10-CM | POA: Diagnosis not present

## 2021-04-01 DIAGNOSIS — R519 Headache, unspecified: Secondary | ICD-10-CM | POA: Diagnosis not present

## 2021-11-12 DIAGNOSIS — Z113 Encounter for screening for infections with a predominantly sexual mode of transmission: Secondary | ICD-10-CM | POA: Diagnosis not present

## 2021-11-12 DIAGNOSIS — A6000 Herpesviral infection of urogenital system, unspecified: Secondary | ICD-10-CM | POA: Diagnosis not present

## 2021-12-06 ENCOUNTER — Ambulatory Visit: Payer: BC Managed Care – PPO | Admitting: Family Medicine

## 2021-12-06 ENCOUNTER — Encounter: Payer: Self-pay | Admitting: Family Medicine

## 2021-12-06 VITALS — BP 102/63 | HR 60 | Temp 97.9°F | Ht 69.0 in | Wt 143.0 lb

## 2021-12-06 DIAGNOSIS — Z7689 Persons encountering health services in other specified circumstances: Secondary | ICD-10-CM

## 2021-12-06 DIAGNOSIS — K219 Gastro-esophageal reflux disease without esophagitis: Secondary | ICD-10-CM | POA: Diagnosis not present

## 2021-12-06 DIAGNOSIS — F1910 Other psychoactive substance abuse, uncomplicated: Secondary | ICD-10-CM

## 2021-12-06 DIAGNOSIS — Z720 Tobacco use: Secondary | ICD-10-CM

## 2021-12-06 DIAGNOSIS — A6000 Herpesviral infection of urogenital system, unspecified: Secondary | ICD-10-CM

## 2021-12-06 DIAGNOSIS — Z13220 Encounter for screening for lipoid disorders: Secondary | ICD-10-CM

## 2021-12-06 DIAGNOSIS — Z1329 Encounter for screening for other suspected endocrine disorder: Secondary | ICD-10-CM

## 2021-12-06 MED ORDER — ESOMEPRAZOLE MAGNESIUM 20 MG PO CPDR: 20.0000 mg | DELAYED_RELEASE_CAPSULE | Freq: Every day | ORAL | 1 refills | Status: DC

## 2021-12-06 MED ORDER — VALACYCLOVIR HCL 500 MG PO TABS: 500.0000 mg | ORAL_TABLET | Freq: Two times a day (BID) | ORAL | 1 refills | Status: DC

## 2021-12-06 NOTE — Patient Instructions (Signed)
Food Choices for Gastroesophageal Reflux Disease, Adult °When you have gastroesophageal reflux disease (GERD), the foods you eat and your eating habits are very important. Choosing the right foods can help ease the discomfort of GERD. Consider working with a dietitian to help you make healthy food choices. °What are tips for following this plan? °Reading food labels °Look for foods that are low in saturated fat. Foods that have less than 5% of daily value (DV) of fat and 0 g of trans fats may help with your symptoms. °Cooking °Cook foods using methods other than frying. This may include baking, steaming, grilling, or broiling. These are all methods that do not need a lot of fat for cooking. °To add flavor, try to use herbs that are low in spice and acidity. °Meal planning ° °Choose healthy foods that are low in fat, such as fruits, vegetables, whole grains, low-fat dairy products, lean meats, fish, and poultry. °Eat frequent, small meals instead of three large meals each day. Eat your meals slowly, in a relaxed setting. Avoid bending over or lying down until 2-3 hours after eating. °Limit high-fat foods such as fatty meats or fried foods. °Limit your intake of fatty foods, such as oils, butter, and shortening. °Avoid the following as told by your health care provider: °Foods that cause symptoms. These may be different for different people. Keep a food diary to keep track of foods that cause symptoms. °Alcohol. °Drinking large amounts of liquid with meals. °Eating meals during the 2-3 hours before bed. °Lifestyle °Maintain a healthy weight. Ask your health care provider what weight is healthy for you. If you need to lose weight, work with your health care provider to do so safely. °Exercise for at least 30 minutes on 5 or more days each week, or as told by your health care provider. °Avoid wearing clothes that fit tightly around your waist and chest. °Do not use any products that contain nicotine or tobacco. These  products include cigarettes, chewing tobacco, and vaping devices, such as e-cigarettes. If you need help quitting, ask your health care provider. °Sleep with the head of your bed raised. Use a wedge under the mattress or blocks under the bed frame to raise the head of the bed. °Chew sugar-free gum after mealtimes. °What foods should I eat? °Eat a healthy, well-balanced diet of fruits, vegetables, whole grains, low-fat dairy products, lean meats, fish, and poultry. Each person is different. Foods that may trigger symptoms in one person may not trigger any symptoms in another person. Work with your health care provider to identify foods that are safe for you. °The items listed above may not be a complete list of recommended foods and beverages. Contact a dietitian for more information. °What foods should I avoid? °Limiting some of these foods may help manage the symptoms of GERD. Everyone is different. Consult a dietitian or your health care provider to help you identify the exact foods to avoid, if any. °Fruits °Any fruits prepared with added fat. Any fruits that cause symptoms. For some people this may include citrus fruits, such as oranges, grapefruit, pineapple, and lemons. °Vegetables °Deep-fried vegetables. French fries. Any vegetables prepared with added fat. Any vegetables that cause symptoms. For some people, this may include tomatoes and tomato products, chili peppers, onions and garlic, and horseradish. °Grains °Pastries or quick breads with added fat. °Meats and other proteins °High-fat meats, such as fatty beef or pork, hot dogs, ribs, ham, sausage, salami, and bacon. Fried meat or protein, including fried   fish and fried chicken. Nuts and nut butters, in large amounts. °Dairy °Whole milk and chocolate milk. Sour cream. Cream. Ice cream. Cream cheese. Milkshakes. °Fats and oils °Butter. Margarine. Shortening. Ghee. °Beverages °Coffee and tea, with or without caffeine. Carbonated beverages. Sodas. Energy  drinks. Fruit juice made with acidic fruits, such as orange or grapefruit. Tomato juice. Alcoholic drinks. °Sweets and desserts °Chocolate and cocoa. Donuts. °Seasonings and condiments °Pepper. Peppermint and spearmint. Added salt. Any condiments, herbs, or seasonings that cause symptoms. For some people, this may include curry, hot sauce, or vinegar-based salad dressings. °The items listed above may not be a complete list of foods and beverages to avoid. Contact a dietitian for more information. °Questions to ask your health care provider °Diet and lifestyle changes are usually the first steps that are taken to manage symptoms of GERD. If diet and lifestyle changes do not improve your symptoms, talk with your health care provider about taking medicines. °Where to find more information °International Foundation for Gastrointestinal Disorders: aboutgerd.org °Summary °When you have gastroesophageal reflux disease (GERD), food and lifestyle choices may be very helpful in easing the discomfort of GERD. °Eat frequent, small meals instead of three large meals each day. Eat your meals slowly, in a relaxed setting. Avoid bending over or lying down until 2-3 hours after eating. °Limit high-fat foods such as fatty meats or fried foods. °This information is not intended to replace advice given to you by your health care provider. Make sure you discuss any questions you have with your health care provider. °Document Revised: 03/12/2020 Document Reviewed: 03/12/2020 °Elsevier Patient Education © 2022 [employer] Inc. ° °

## 2021-12-06 NOTE — Progress Notes (Signed)
? ?New Patient Office Visit ? ?Subjective:  ?Patient ID: Adrian Chandler, male    DOB: 26-Nov-1985  Age: 36 y.o. MRN: [id number] ? ?CC:  ?Chief Complaint  ?Patient presents with  ? New Patient (Initial Visit)  ? ? ?HPI ?Adrian Chandler presents to establish care.  ? ?He reports a hx of GERD for 2 years. He reports heartburn and frequent belching. He takes 6-10 tums a day. He denies abdominal pain, weight loss, vomiting, dysphagia, diarrhea, cough, sore throat, hoarseness, or blood in stool.  ? ?He was recently diagnosed with HSV a few months ago. He had a full STD screening done at that appointment. He reports recurrent outbreaks and would like to discuss suppressive therapy.  ? ?He has a history of substance use. Hx of cocaine, benzo, and narcotics were last used in 2013. He currently uses marijuana, tobacco, and alcohol.  ? ? ?  12/06/2021  ? 10:50 AM  ?Depression screen PHQ 2/9  ?Decreased Interest 0  ?Down, Depressed, Hopeless 0  ?PHQ - 2 Score 0  ?Altered sleeping 0  ?Tired, decreased energy 0  ?Change in appetite 0  ?Feeling bad or failure about yourself  0  ?Trouble concentrating 0  ?Moving slowly or fidgety/restless 0  ?Suicidal thoughts 0  ?PHQ-9 Score 0  ?Difficult doing work/chores Not difficult at all  ? ? ?  12/06/2021  ? 10:50 AM  ?GAD 7 : Generalized Anxiety Score  ?Nervous, Anxious, on Edge 0  ?Control/stop worrying 0  ?Worry too much - different things 0  ?Trouble relaxing 0  ?Restless 0  ?Easily annoyed or irritable 0  ?Afraid - awful might happen 0  ?Total GAD 7 Score 0  ?Anxiety Difficulty Not difficult at all  ? ? ? ? ?Past Medical History:  ?Diagnosis Date  ? Childhood asthma   ? Ruptured tympanic membrane   ? Substance abuse ([place])   ? cocaine, benzodiazepines, narcotics-last used 2013  ? ? ?Past Surgical History:  ?Procedure Laterality Date  ? TONSILLECTOMY    ? ? ?Family History  ?Problem Relation Age of Onset  ? Hypertension Mother   ? Hyperlipidemia Mother   ? Depression Mother   ?  Asthma Mother   ? Arthritis Mother   ? Anxiety disorder Mother   ? Drug abuse Father   ? Alcohol abuse Father   ? ? ?Social History  ? ?Socioeconomic History  ? Marital status: Single  ?  Spouse name: Not on file  ? Number of children: 0  ? Years of education: 70  ? Highest education level: High school graduate  ?Occupational History  ? Not on file  ?Tobacco Use  ? Smoking status: Every Day  ?  Packs/day: 0.75  ?  Years: 14.00  ?  Pack years: 10.50  ?  Types: Cigarettes  ? Smokeless tobacco: Never  ?Vaping Use  ? Vaping Use: Never used  ?Substance and Sexual Activity  ? Alcohol use: Yes  ?  Alcohol/week: 21.0 standard drinks  ?  Types: 21 Cans of beer per week  ? Drug use: Yes  ?  Frequency: 7.0 times per week  ?  Types: Marijuana  ?  Comment: history of cocaine,benzodiazepines, narcotics-last use 2013  ? Sexual activity: Yes  ?  Birth control/protection: Condom  ?Other Topics Concern  ? Not on file  ?Social History Narrative  ? ** Merged History Encounter **  ?    ? ?Social Determinants of Health  ? ?Financial Resource Strain: Not on  file  ?Food Insecurity: Not on file  ?Transportation Needs: Not on file  ?Physical Activity: Not on file  ?Stress: Not on file  ?Social Connections: Not on file  ?Intimate Partner Violence: Not on file  ? ? ?ROS ?Review of Systems ?Negative unless specially indicated above in HPI. ? ?Objective:  ? ?Today's Vitals: BP 102/63   Pulse 60   Temp 97.9 ?F (36.6 ?C) (Temporal)   Ht 5' 9"  (1.753 m)   Wt 143 lb (64.9 kg)   SpO2 99%   BMI 21.12 kg/m?  ? ?Physical Exam ?Vitals and nursing note reviewed.  ?Constitutional:   ?   General: He is not in acute distress. ?   Appearance: He is not ill-appearing, toxic-appearing or diaphoretic.  ?HENT:  ?   Head: Normocephalic and atraumatic.  ?   Right Ear: Tympanic membrane, ear canal and external ear normal.  ?   Left Ear: Tympanic membrane, ear canal and external ear normal.  ?   Nose: Nose normal.  ?   Mouth/Throat:  ?   Mouth: Mucous membranes  are moist.  ?   Pharynx: Oropharynx is clear.  ?Eyes:  ?   Extraocular Movements: Extraocular movements intact.  ?   Conjunctiva/sclera: Conjunctivae normal.  ?   Pupils: Pupils are equal, round, and reactive to light.  ?Cardiovascular:  ?   Rate and Rhythm: Normal rate and regular rhythm.  ?   Heart sounds: Normal heart sounds. No murmur heard. ?Pulmonary:  ?   Effort: Pulmonary effort is normal. No respiratory distress.  ?   Breath sounds: Normal breath sounds.  ?Abdominal:  ?   General: Bowel sounds are normal. There is no distension.  ?   Palpations: Abdomen is soft.  ?   Tenderness: There is no abdominal tenderness. There is no guarding or rebound.  ?Musculoskeletal:  ?   Right lower leg: No edema.  ?   Left lower leg: No edema.  ?Skin: ?   General: Skin is warm and dry.  ?Neurological:  ?   Mental Status: He is alert and oriented to person, place, and time.  ?   Motor: No weakness.  ?   Gait: Gait normal.  ?Psychiatric:     ?   Mood and Affect: Mood normal.     ?   Behavior: Behavior normal.  ? ? ?Assessment & Plan:  ? ?Adrian Chandler was seen today for new patient (initial visit). ? ?Diagnoses and all orders for this visit: ? ?Gastroesophageal reflux disease, unspecified whether esophagitis present ?Start nexium as below. Labs pending.  ?-     esomeprazole (NEXIUM) 20 MG capsule; Take 1 capsule (20 mg total) by mouth daily at 12 noon. ?-     CBC with Differential/Platelet ?-     CMP14+EGFR ? ?Recurrent genital HSV (herpes simplex virus) infection ?Valtrex as below. Discussed prevention of transmission. Has recently had full STD screening.  ?-     valACYclovir (VALTREX) 500 MG tablet; Take 1 tablet (500 mg total) by mouth 2 (two) times daily. ? ?Substance abuse ([place]) ?Current marijuana use. 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  ? ?Tobacco abuse ?Current smoker, not ready to quit.  ? ?Screening for lipid disorders ?-     Lipid panel ? ?Encounter for screening for endocrine disorder ?-     Thyroid Panel  With TSH ? ?Encounter to establish care ? ? ?Follow-up: Return in about 6 weeks (around 01/17/2022) for GERD. ? ?The patient indicates understanding of  these issues and agrees with the plan.  ? ?Gwenlyn Perking, FNP ? ?

## 2021-12-07 LAB — CBC WITH DIFFERENTIAL/PLATELET
Basophils Absolute: 0 10*3/uL (ref 0.0–0.2)
Basos: 0 %
EOS (ABSOLUTE): 0.1 10*3/uL (ref 0.0–0.4)
Eos: 1 %
Hematocrit: 46.1 % (ref 37.5–51.0)
Hemoglobin: 15.5 g/dL (ref 13.0–17.7)
Immature Grans (Abs): 0 10*3/uL (ref 0.0–0.1)
Immature Granulocytes: 0 %
Lymphocytes Absolute: 2.1 10*3/uL (ref 0.7–3.1)
Lymphs: 20 %
MCH: 33.3 pg — ABNORMAL HIGH (ref 26.6–33.0)
MCHC: 33.6 g/dL (ref 31.5–35.7)
MCV: 99 fL — ABNORMAL HIGH (ref 79–97)
Monocytes Absolute: 0.9 10*3/uL (ref 0.1–0.9)
Monocytes: 8 %
Neutrophils Absolute: 7.4 10*3/uL — ABNORMAL HIGH (ref 1.4–7.0)
Neutrophils: 71 %
Platelets: 290 10*3/uL (ref 150–450)
RBC: 4.66 x10E6/uL (ref 4.14–5.80)
RDW: 13 % (ref 11.6–15.4)
WBC: 10.5 10*3/uL (ref 3.4–10.8)

## 2021-12-07 LAB — CMP14+EGFR
ALT: 21 IU/L (ref 0–44)
AST: 18 IU/L (ref 0–40)
Albumin/Globulin Ratio: 2.5 — ABNORMAL HIGH (ref 1.2–2.2)
Albumin: 4.7 g/dL (ref 4.0–5.0)
Alkaline Phosphatase: 83 IU/L (ref 44–121)
BUN/Creatinine Ratio: 13 (ref 9–20)
BUN: 12 mg/dL (ref 6–20)
Bilirubin Total: 0.3 mg/dL (ref 0.0–1.2)
CO2: 25 mmol/L (ref 20–29)
Calcium: 9.8 mg/dL (ref 8.7–10.2)
Chloride: 103 mmol/L (ref 96–106)
Creatinine, Ser: 0.9 mg/dL (ref 0.76–1.27)
Globulin, Total: 1.9 g/dL (ref 1.5–4.5)
Glucose: 86 mg/dL (ref 70–99)
Potassium: 4.8 mmol/L (ref 3.5–5.2)
Sodium: 141 mmol/L (ref 134–144)
Total Protein: 6.6 g/dL (ref 6.0–8.5)
eGFR: 116 mL/min/{1.73_m2} (ref >59)

## 2021-12-07 LAB — THYROID PANEL WITH TSH
Free Thyroxine Index: 1.9 (ref 1.2–4.9)
T3 Uptake Ratio: 27 % (ref 24–39)
T4, Total: 7.2 ug/dL (ref 4.5–12.0)
TSH: 2.39 u[IU]/mL (ref 0.450–4.500)

## 2021-12-07 LAB — LIPID PANEL
Chol/HDL Ratio: 4.8 ratio (ref 0.0–5.0)
Cholesterol, Total: 178 mg/dL (ref 100–199)
HDL: 37 mg/dL — ABNORMAL LOW (ref >39)
LDL Chol Calc (NIH): 127 mg/dL — ABNORMAL HIGH (ref 0–99)
Triglycerides: 75 mg/dL (ref 0–149)
VLDL Cholesterol Cal: 14 mg/dL (ref 5–40)

## 2022-01-17 ENCOUNTER — Ambulatory Visit: Payer: BC Managed Care – PPO | Admitting: Family Medicine

## 2022-03-28 ENCOUNTER — Ambulatory Visit: Payer: BC Managed Care – PPO | Admitting: Family Medicine

## 2022-03-31 ENCOUNTER — Encounter: Payer: Self-pay | Admitting: Family Medicine

## 2022-03-31 ENCOUNTER — Ambulatory Visit: Payer: BC Managed Care – PPO | Admitting: Family Medicine

## 2022-03-31 VITALS — BP 108/74 | HR 62 | Temp 97.9°F | Resp 20 | Ht 69.0 in | Wt 142.0 lb

## 2022-03-31 DIAGNOSIS — H60501 Unspecified acute noninfective otitis externa, right ear: Secondary | ICD-10-CM

## 2022-03-31 DIAGNOSIS — R450 Nervousness: Secondary | ICD-10-CM | POA: Diagnosis not present

## 2022-03-31 DIAGNOSIS — K219 Gastro-esophageal reflux disease without esophagitis: Secondary | ICD-10-CM | POA: Diagnosis not present

## 2022-03-31 DIAGNOSIS — R739 Hyperglycemia, unspecified: Secondary | ICD-10-CM | POA: Diagnosis not present

## 2022-03-31 DIAGNOSIS — A6000 Herpesviral infection of urogenital system, unspecified: Secondary | ICD-10-CM

## 2022-03-31 LAB — BAYER DCA HB A1C WAIVED: HB A1C (BAYER DCA - WAIVED): 5.3 % (ref 4.8–5.6)

## 2022-03-31 MED ORDER — FAMOTIDINE 20 MG PO TABS: 20.0000 mg | ORAL_TABLET | Freq: Two times a day (BID) | ORAL | 1 refills | Status: AC

## 2022-03-31 MED ORDER — OFLOXACIN 0.3 % OT SOLN: 10.0000 [drp] | Freq: Every day | OTIC | 0 refills | Status: AC

## 2022-03-31 MED ORDER — VALACYCLOVIR HCL 500 MG PO TABS: 500.0000 mg | ORAL_TABLET | Freq: Two times a day (BID) | ORAL | 1 refills | Status: DC

## 2022-03-31 MED ORDER — ESOMEPRAZOLE MAGNESIUM 20 MG PO CPDR: 20.0000 mg | DELAYED_RELEASE_CAPSULE | Freq: Every day | ORAL | 1 refills | Status: AC

## 2022-03-31 NOTE — Progress Notes (Signed)
Acute Office Visit  Subjective:     Patient ID: Adrian Chandler, male    DOB: 03-Jun-1986, 36 y.o.   MRN: [id number]  Chief Complaint  Patient presents with   follow up gerd   Ear Pain    HPI Patient is in today for GERD follow up.   GERD Compliant with medications - Yes Current medications - Nexium Reports some intermittent heartburn Cough - No Sore throat - No Voice change - No Hemoptysis - No Dysphagia  - No Water brash - No Red Flags (weight loss, hematochezia, melena, weight loss, early satiety, fevers, odynophagia, or persistent vomiting) - No  2. Right ear pain He reports sharp pain in his right ear for 5 days. He has had yellow drainage. He reports a history of frequent otitis externa infections. He also swims often. Denies URI symptoms, fever, or changes in hearing.   3. Jittery feeling [name] also reports episodes of feeling jittery. This usually occurs after he hasn't eaten in awhile. He wakes up very early and does not eat breakfast. He eats usually 2 meals a day. He really would like to have his a1c checked today.   ROS Negative unless specially indicated above in HPI.     Objective:    BP 108/74   Pulse 62   Temp 97.9 F (36.6 C) (Oral)   Resp 20   Ht 5\' 9"  (1.753 m)   Wt 142 lb (64.4 kg)   SpO2 100%   BMI 20.97 kg/m  BP Readings from Last 3 Encounters:  03/31/22 108/74  12/06/21 102/63  03/23/21 119/69      Physical Exam Vitals and nursing note reviewed.  Constitutional:      General: He is not in acute distress.    Appearance: He is not ill-appearing, toxic-appearing or diaphoretic.  HENT:     Right Ear: Tympanic membrane normal. Drainage (yellow), swelling (canal) and tenderness (canal) present.     Left Ear: Tympanic membrane and ear canal normal.  Neck:     Thyroid: No thyroid mass, thyromegaly or thyroid tenderness.     Vascular: No carotid bruit or JVD.  Cardiovascular:     Rate and Rhythm: Normal rate and regular rhythm.      Heart sounds: Normal heart sounds. No murmur heard. Pulmonary:     Effort: Pulmonary effort is normal. No respiratory distress.     Breath sounds: Normal breath sounds.  Abdominal:     General: Bowel sounds are normal. There is no distension.     Palpations: Abdomen is soft.     Tenderness: There is no abdominal tenderness. There is no guarding or rebound.  Musculoskeletal:     Right lower leg: No edema.     Left lower leg: No edema.  Skin:    General: Skin is warm and dry.  Neurological:     General: No focal deficit present.     Mental Status: He is alert and oriented to person, place, and time.  Psychiatric:        Mood and Affect: Mood normal.        Behavior: Behavior normal.     No results found for any visits on 03/31/22.      Assessment & Plan:   Adrian Chandler was seen today for follow up gerd and ear pain.  Diagnoses and all orders for this visit:  Gastroesophageal reflux disease, unspecified whether esophagitis present Fairly well controlled with nexium. Will add pepcid bid prn. Diet and lifestyle management.  -  esomeprazole (NEXIUM) 20 MG capsule; Take 1 capsule (20 mg total) by mouth daily at 12 noon. -     famotidine (PEPCID) 20 MG tablet; Take 1 tablet (20 mg total) by mouth 2 (two) times daily.  Recurrent genital HSV (herpes simplex virus) infection Well controlled on current regimen.  -     valACYclovir (VALTREX) 500 MG tablet; Take 1 tablet (500 mg total) by mouth 2 (two) times daily.  Acute otitis externa of right ear, unspecified type Ofloxacin as below. Discussed prevention.  -     ofloxacin (FLOXIN OTIC) 0.3 % OTIC solution; Place 10 drops into the right ear daily for 7 days.  Jittery feeling Discussed likely due to hypoglycemia from not eating regularly. Discussed that we don't really need to check and a1c and that insurance may not cover this. He requested to have this checked anyway. Had normal labs a few months ago.  -     Bayer DCA Hb A1c  Waived  Return in about 8 months (around 12/07/2022) for CPE.  The patient indicates understanding of these issues and agrees with the plan.   Adrian Earing, FNP

## 2022-03-31 NOTE — Patient Instructions (Signed)
Otitis Externa  Otitis externa is an infection of the outer ear canal. The outer ear canal is the area between the outside of the ear and the eardrum. Otitis externa is sometimes called swimmer's ear. What are the causes? Common causes of this condition include: Swimming in dirty water. Moisture in the ear. An injury to the inside of the ear. An object stuck in the ear. A cut or scrape on the outside of the ear or in the ear canal. What increases the risk? You are more likely to develop this condition if you go swimming often. What are the signs or symptoms? The first symptom of this condition is often itching in the ear. Later symptoms of the condition include: Swelling of the ear. Redness in the ear. Ear pain. The pain may get worse when you pull on your ear. Pus coming from the ear. How is this diagnosed? This condition may be diagnosed by examining the ear and testing fluid from the ear for bacteria and funguses. How is this treated? This condition may be treated with: Antibiotic ear drops. These are often given for 10-14 days. Medicines to reduce itching and swelling. Follow these instructions at home: If you were prescribed antibiotic ear drops, use them as told by your health care provider. Do not stop using the antibiotic even if you start to feel better. Take over-the-counter and prescription medicines only as told by your health care provider. Avoid getting water in your ears as told by your health care provider. This may include avoiding swimming or water sports for a few days. Keep all follow-up visits. This is important. How is this prevented? Keep your ears dry. Use the corner of a towel to dry your ears after you swim or bathe. Avoid scratching or putting things in your ear. Doing these things can damage the ear canal or remove the protective wax that lines it, which makes it easier for bacteria and funguses to grow. Avoid swimming in lakes, polluted water, or swimming  pools that may not have enough chlorine. Contact a health care provider if: You have a fever. Your ear is still red, swollen, painful, or draining pus after 3 days. Your redness, swelling, or pain gets worse. You have a severe headache. Get help right away if: You have redness, swelling, and pain or tenderness in the area behind your ear. Summary Otitis externa is an infection of the outer ear canal. Common causes include swimming in dirty water, moisture in the ear, or a cut or scrape in the ear. Symptoms include pain, redness, and swelling of the ear canal. If you were prescribed antibiotic ear drops, use them as told by your health care provider. Do not stop using the antibiotic even if you start to feel better. This information is not intended to replace advice given to you by your health care provider. Make sure you discuss any questions you have with your health care provider. Document Revised: 11/14/2020 Document Reviewed: 11/14/2020 Elsevier Patient Education  2023 [employer] Inc.  

## 2022-08-04 ENCOUNTER — Ambulatory Visit
Admission: EM | Admit: 2022-08-04 | Discharge: 2022-08-04 | Disposition: A | Discharge status: Home / Self Care | Payer: BC Managed Care – PPO | Attending: Physician Assistant | Admitting: Physician Assistant

## 2022-08-04 DIAGNOSIS — Z1152 Encounter for screening for COVID-19: Secondary | ICD-10-CM | POA: Insufficient documentation

## 2022-08-04 DIAGNOSIS — J069 Acute upper respiratory infection, unspecified: Secondary | ICD-10-CM | POA: Insufficient documentation

## 2022-08-04 LAB — RESP PANEL BY RT-PCR (FLU A&B, COVID) ARPGX2
Influenza A by PCR: NEGATIVE
Influenza B by PCR: POSITIVE — AB
SARS Coronavirus 2 by RT PCR: NEGATIVE

## 2022-08-04 NOTE — ED Triage Notes (Signed)
Pt reports flu like symptoms. Over the weekend his fever was 100.2. He is also experiencing body aches, chills, headache and diarrhea. Took dayqill, ibuprofen, and niquill

## 2022-08-04 NOTE — ED Provider Notes (Signed)
[facility]    CSN: [id number] Arrival date & time: 08/04/22  1047      History   Chief Complaint No chief complaint on file.   HPI Adrian Chandler is a 36 y.o. male.   Pt complains of a cough and congestion.  Pt reports he had a fever over the weekend.  Pt complains of still feeling achy and weak.    The history is provided by the patient. No language interpreter was used.  Cough Cough characteristics:  Productive Sputum characteristics:  Nondescript Severity:  Moderate Onset quality:  Gradual Duration:  3 days Timing:  Constant Progression:  Worsening Chronicity:  New Smoker: yes   Context: upper respiratory infection   Relieved by:  Nothing Worsened by:  Nothing Ineffective treatments:  None tried Associated symptoms: no fever     Past Medical History:  Diagnosis Date   Childhood asthma    Ruptured tympanic membrane    Substance abuse (HCC)    cocaine, benzodiazepines, narcotics-last used 2013    Patient Active Problem List   Diagnosis Date Noted   Gastroesophageal reflux disease 12/06/2021   Recurrent genital HSV (herpes simplex virus) infection 12/06/2021   Substance abuse (HCC) 12/06/2021   Tobacco abuse 12/06/2021    Past Surgical History:  Procedure Laterality Date   TONSILLECTOMY         Home Medications    Prior to Admission medications   Medication Sig Start Date End Date Taking? Authorizing Provider  esomeprazole (NEXIUM) 20 MG capsule Take 1 capsule (20 mg total) by mouth daily at 12 noon. 03/31/22   [name], FNP  famotidine (PEPCID) 20 MG tablet Take 1 tablet (20 mg total) by mouth 2 (two) times daily. 03/31/22   [name], FNP  valACYclovir (VALTREX) 500 MG tablet Take 1 tablet (500 mg total) by mouth 2 (two) times daily. 03/31/22   [name], FNP    Family History Family History  Problem Relation Age of Onset   Hypertension Mother    Hyperlipidemia Mother    Depression Mother     Asthma Mother    Arthritis Mother    Anxiety disorder Mother    Drug abuse Father    Alcohol abuse Father     Social History Social History   Tobacco Use   Smoking status: Every Day    Packs/day: 0.75    Years: 14.00    Total pack years: 10.50    Types: Cigarettes   Smokeless tobacco: Never  Vaping Use   Vaping Use: Never used  Substance Use Topics   Alcohol use: Yes    Alcohol/week: 21.0 standard drinks of alcohol    Types: 21 Cans of beer per week   Drug use: Yes    Frequency: 7.0 times per week    Types: Marijuana    Comment: history of cocaine,benzodiazepines, narcotics-last use 2013     Allergies   Patient has no known allergies.   Review of Systems Review of Systems  Constitutional:  Negative for fever.  Respiratory:  Positive for cough.   All other systems reviewed and are negative.    Physical Exam Triage Vital Signs ED Triage Vitals  Enc Vitals Group     BP 08/04/22 1215 110/67     Pulse Rate 08/04/22 1215 62     Resp 08/04/22 1215 20     Temp 08/04/22 1215 97.6 F (36.4 C)     Temp Source 08/04/22 1215 Oral  SpO2 08/04/22 1215 98 %     Weight --      Height --      Head Circumference --      Peak Flow --      Pain Score 08/04/22 1216 7     Pain Loc --      Pain Edu? --      Excl. in [place]? --    No data found.  Updated Vital Signs BP 110/67 (BP Location: Right Arm)   Pulse 62   Temp 97.6 F (36.4 C) (Oral)   Resp 20   SpO2 98%   Visual Acuity Right Eye Distance:   Left Eye Distance:   Bilateral Distance:    Right Eye Near:   Left Eye Near:    Bilateral Near:     Physical Exam Vitals and nursing note reviewed.  Constitutional:      Appearance: He is well-developed.  HENT:     Head: Normocephalic.     Right Ear: Tympanic membrane normal.     Left Ear: Tympanic membrane normal.     Mouth/Throat:     Mouth: Mucous membranes are moist.  Cardiovascular:     Rate and Rhythm: Normal rate.  Pulmonary:     Effort: Pulmonary  effort is normal.  Abdominal:     General: Abdomen is flat. There is no distension.  Musculoskeletal:        General: Normal range of motion.     Cervical back: Normal range of motion.  Skin:    General: Skin is warm.  Neurological:     General: No focal deficit present.     Mental Status: He is alert and oriented to person, place, and time.      UC Treatments / Results  Labs (all labs ordered are listed, but only abnormal results are displayed) Labs Reviewed  RESP PANEL BY RT-PCR (FLU A&B, COVID) [id number]    EKG   Radiology No results found.  Procedures Procedures (including critical care time)  Medications Ordered in UC Medications - No data to display  Initial Impression / Assessment and Plan / UC Course  I have reviewed the triage vital signs and the nursing notes.  Pertinent labs & imaging results that were available during my care of the patient were reviewed by me and considered in my medical decision making (see chart for details).     MDM:  Pt advised to continue otc medications.  OOw x 2 days.  Recheck if any problems.  Final Clinical Impressions(s) / UC Diagnoses   Final diagnoses:  Viral URI   Discharge Instructions   None    ED Prescriptions   None    PDMP not reviewed this encounter. An After Visit Summary was printed and given to the patient.    Fransico Meadow, [place] 08/04/22 1256

## 2023-09-10 ENCOUNTER — Telehealth: Payer: Self-pay | Admitting: *Deleted

## 2023-09-10 ENCOUNTER — Other Ambulatory Visit: Payer: Self-pay | Admitting: Family Medicine

## 2023-09-10 DIAGNOSIS — A6000 Herpesviral infection of urogenital system, unspecified: Secondary | ICD-10-CM

## 2023-09-10 NOTE — Telephone Encounter (Signed)
Pt ntbs for refill since it has been over a year since he has been seen. Please schedule with PCP

## 2023-09-10 NOTE — Telephone Encounter (Signed)
Left message for pt to call back to make an appt.

## 2023-09-10 NOTE — Telephone Encounter (Signed)
Copied from CRM [id number]. Topic: Clinical - Medication Refill >> Sep 10, 2023  1:11 PM Adrian Chandler wrote: Most Recent Primary Care Visit:  Provider: [name]  Department: [facility]  Visit Type: OFFICE VISIT  Date: 03/31/2022  Medication: valACYclovir (VALTREX) 500 MG tablet  Has the patient contacted their pharmacy? No Patient stated he has 0 refills   Is this the correct pharmacy for this prescription? Yes If no, delete pharmacy and type the correct one.  This is the patient's preferred pharmacy:   [id number] - [place], [place] - [address] [place] [address] Phone: [phone] Fax: [phone]      Has the prescription been filled recently? No  Is the patient out of the medication? Yes  Has the patient been seen for an appointment in the last year OR does the patient have an upcoming appointment? Yes  Can we respond through MyChart? No, phone  Agent: Please be advised that Rx refills may take up to 3 business days. We ask that you follow-up with your pharmacy.

## 2023-09-18 ENCOUNTER — Telehealth: Payer: Self-pay | Admitting: Family Medicine

## 2023-09-18 NOTE — Telephone Encounter (Signed)
 Pt has appt scheduled.

## 2023-09-18 NOTE — Telephone Encounter (Signed)
NTBS.

## 2023-09-18 NOTE — Telephone Encounter (Signed)
 Copied from CRM [id number]. Topic: Clinical - Prescription Issue >> Sep 18, 2023  3:38 PM Delon T wrote: Reason for CRM: [facility]  (VALTREX ) 500 MG tablet, they say it has been too long since he filled it last.  Please call patient, he does not want to schedule appointment unless he has to [phone]

## 2023-09-28 ENCOUNTER — Encounter: Payer: Self-pay | Admitting: Family Medicine

## 2023-09-28 ENCOUNTER — Ambulatory Visit: Payer: Commercial Managed Care - PPO | Admitting: Family Medicine

## 2023-09-28 VITALS — BP 102/54 | HR 67 | Temp 98.3°F | Ht 69.0 in | Wt 146.4 lb

## 2023-09-28 DIAGNOSIS — R590 Localized enlarged lymph nodes: Secondary | ICD-10-CM

## 2023-09-28 DIAGNOSIS — B002 Herpesviral gingivostomatitis and pharyngotonsillitis: Secondary | ICD-10-CM | POA: Insufficient documentation

## 2023-09-28 DIAGNOSIS — A6000 Herpesviral infection of urogenital system, unspecified: Secondary | ICD-10-CM | POA: Diagnosis not present

## 2023-09-28 MED ORDER — VALACYCLOVIR HCL 500 MG PO TABS: 500.0000 mg | ORAL_TABLET | Freq: Every day | ORAL | 3 refills | Status: DC

## 2023-09-28 NOTE — Progress Notes (Signed)
   Acute Office Visit  Subjective:     Patient ID: Adrian Chandler, male    DOB: [birth year], 38 y.o.   MRN: [id number]  Chief Complaint  Patient presents with   Medication Refill    Medication Refill   Patient is in today for a refill of valtrex . He has been taking 500 mg daily. Hx of both genital and oral HSV. He reports well controlled overall with suppressive therapy with 1 outbreak about 4 months ago.   He had a swollen tender lymph node on the left side of his neck for about 1 week. Intermittent pain extending up for left ear. Has some crusting in his left ear. Mild nasal congestion. Symptoms now resolved.   ROS As per HPI.      Objective:    BP (!) 102/54   Pulse 67   Temp 98.3 F (36.8 C) (Temporal)   Ht 5' 9 (1.753 m)   Wt 146 lb 6.4 oz (66.4 kg)   SpO2 98%   BMI 21.62 kg/m    Physical Exam Vitals and nursing note reviewed.  Constitutional:      General: He is not in acute distress.    Appearance: He is not ill-appearing or toxic-appearing.  HENT:     Right Ear: Tympanic membrane, ear canal and external ear normal.     Left Ear: Tympanic membrane, ear canal and external ear normal.     Nose: Nose normal.     Mouth/Throat:     Mouth: Mucous membranes are moist.     Pharynx: Oropharynx is clear.  Eyes:     General:        Right eye: No discharge.        Left eye: No discharge.     Conjunctiva/sclera: Conjunctivae normal.  Cardiovascular:     Rate and Rhythm: Normal rate and regular rhythm.     Heart sounds: Normal heart sounds. No murmur heard. Musculoskeletal:     Cervical back: Neck supple. No rigidity.  Lymphadenopathy:     Cervical: No cervical adenopathy.  Skin:    General: Skin is warm and dry.  Neurological:     General: No focal deficit present.     Mental Status: He is alert and oriented to person, place, and time.  Psychiatric:        Mood and Affect: Mood normal.        Behavior: Behavior normal.     No results found for  any visits on 09/28/23.      Assessment & Plan:   Jemari Hallum was seen today for medication refill.  Diagnoses and all orders for this visit:  Recurrent oral herpes simplex Recurrent genital HSV (herpes simplex virus) infection Well controlled on current regimen.  -     valACYclovir  (VALTREX ) 500 MG tablet; Take 1 tablet (500 mg total) by mouth daily.  Anterior cervical adenopathy No resolved. Discussed likely due to viral URI as he also has nasal congestion.    Return in about 6 months (around 03/27/2024) for CPE.  The patient indicates understanding of these issues and agrees with the plan.  Annabella CHRISTELLA Search, FNP

## 2023-10-15 ENCOUNTER — Ambulatory Visit: Payer: Commercial Managed Care - PPO | Admitting: Family Medicine

## 2023-10-15 ENCOUNTER — Telehealth: Payer: Self-pay | Admitting: Family Medicine

## 2023-10-15 ENCOUNTER — Encounter: Payer: Self-pay | Admitting: Family Medicine

## 2023-10-15 VITALS — BP 96/60 | HR 66 | Temp 98.2°F | Ht 69.0 in | Wt 147.0 lb

## 2023-10-15 DIAGNOSIS — H60312 Diffuse otitis externa, left ear: Secondary | ICD-10-CM

## 2023-10-15 MED ORDER — CIPROFLOXACIN-DEXAMETHASONE 0.3-0.1 % OT SUSP: 4.0000 [drp] | Freq: Two times a day (BID) | OTIC | 0 refills | Status: DC

## 2023-10-15 MED ORDER — KETOROLAC TROMETHAMINE 30 MG/ML IJ SOLN
30.0000 mg | Freq: Once | INTRAMUSCULAR | Status: AC
  Administered 2023-10-15: 30 mg via INTRAMUSCULAR

## 2023-10-15 NOTE — Telephone Encounter (Signed)
Called and spoke with patient he is aware and verbalized understanding. He asked what can help with pain advised can take tylenol and ibuprofen and use warm compress to help relieve pain.

## 2023-10-15 NOTE — Addendum Note (Signed)
Addended by: Daisy Blossom on: 10/15/2023 09:07 AM   Modules accepted: Orders

## 2023-10-15 NOTE — Progress Notes (Signed)
Subjective:  Patient ID: Adrian Chandler, male    DOB: 01-Jan-1986, 38 y.o.   MRN: [id number]  Patient Care Team: [name], FNP as PCP - General (Family Medicine)   Chief Complaint:  Ear Pain (Left ear pain, right starting to hurt as well for 2 days. Normally has issues with ears but this is worse than normal)   HPI: Adrian Chandler is a 38 y.o. male presenting on 10/15/2023 for Ear Pain (Left ear pain, right starting to hurt as well for 2 days. Normally has issues with ears but this is worse than normal)   History of Present Illness   The patient, with a history of tinnitus and previous eardrum perforations, presents with a severe ear infection.  He has been experiencing a severe ear infection for approximately two days, characterized by a sensation of water in the left ear, pain radiating to the teeth and neck, congestion, frequent sneezing, and a scratchy throat.  He has a history of ear issues, including tinnitus and previous eardrum perforations during his [employer]. His ears ring constantly and sometimes leak, forming crusts. He has not consulted an ENT specialist for several years and has primarily managed his ear problems with ibuprofen, especially after experiencing bleeding from the ears [place].  No other symptoms beyond those related to the ear infection.          Relevant past medical, surgical, family, and social history reviewed and updated as indicated.  Allergies and medications reviewed and updated. Data reviewed: Chart in Epic.   Past Medical History:  Diagnosis Date   Childhood asthma    Ruptured tympanic membrane    Substance abuse (HCC)    cocaine, benzodiazepines, narcotics-last used 2013    Past Surgical History:  Procedure Laterality Date   TONSILLECTOMY      Social History   Socioeconomic History   Marital status: Single    Spouse name: Not on file   Number of children: 0   Years of education: 12   Highest  education level: High school graduate  Occupational History   Not on file  Tobacco Use   Smoking status: Every Day    Current packs/day: 0.75    Average packs/day: 0.8 packs/day for 14.0 years (10.5 ttl pk-yrs)    Types: Cigarettes   Smokeless tobacco: Never  Vaping Use   Vaping status: Never Used  Substance and Sexual Activity   Alcohol use: Yes    Alcohol/week: 21.0 standard drinks of alcohol    Types: 21 Cans of beer per week   Drug use: Yes    Frequency: 7.0 times per week    Types: Marijuana    Comment: history of cocaine,benzodiazepines, narcotics-last use 2013   Sexual activity: Yes    Birth control/protection: Condom  Other Topics Concern   Not on file  Social History Narrative   ** Merged History Encounter **       Social Drivers of Corporate investment banker Strain: Not on file  Food Insecurity: Not on file  Transportation Needs: Not on file  Physical Activity: Not on file  Stress: Not on file  Social Connections: Not on file  Intimate Partner Violence: Not on file    Outpatient Encounter Medications as of 10/15/2023  Medication Sig   ciprofloxacin-dexamethasone (CIPRODEX) OTIC suspension Place 4 drops into the left ear 2 (two) times daily for 7 days.   esomeprazole (NEXIUM) 20 MG capsule Take 1 capsule (20 mg total) by  mouth daily at 12 noon.   famotidine (PEPCID) 20 MG tablet Take 1 tablet (20 mg total) by mouth 2 (two) times daily.   valACYclovir (VALTREX) 500 MG tablet Take 1 tablet (500 mg total) by mouth daily.   No facility-administered encounter medications on file as of 10/15/2023.    No Known Allergies  Pertinent ROS per HPI, otherwise unremarkable      Objective:  BP 96/60   Pulse 66   Temp 98.2 F (36.8 C) (Temporal)   Ht 5\' 9"  (1.753 m)   Wt 147 lb (66.7 kg)   SpO2 99%   BMI 21.71 kg/m    Wt Readings from Last 3 Encounters:  10/15/23 147 lb (66.7 kg)  09/28/23 146 lb 6.4 oz (66.4 kg)  03/31/22 142 lb (64.4 kg)    Physical  Exam Vitals and nursing note reviewed.  Constitutional:      Appearance: He is normal weight.     Comments: Appears uncomfortable  HENT:     Head: Normocephalic and atraumatic.     Right Ear: Tympanic membrane, ear canal and external ear normal.     Left Ear: Drainage, swelling and tenderness present. No mastoid tenderness.     Nose: Nose normal.     Mouth/Throat:     Mouth: Mucous membranes are moist.     Pharynx: Oropharynx is clear.  Eyes:     Conjunctiva/sclera: Conjunctivae normal.     Pupils: Pupils are equal, round, and reactive to light.  Cardiovascular:     Rate and Rhythm: Normal rate and regular rhythm.     Heart sounds: Normal heart sounds.  Pulmonary:     Effort: Pulmonary effort is normal.     Breath sounds: Normal breath sounds.  Skin:    General: Skin is warm and dry.     Capillary Refill: Capillary refill takes less than 2 seconds.  Neurological:     General: No focal deficit present.     Mental Status: He is alert and oriented to person, place, and time.  Psychiatric:        Mood and Affect: Mood normal.        Behavior: Behavior normal.        Thought Content: Thought content normal.        Judgment: Judgment normal.      Results for orders placed or performed during the hospital encounter of 08/04/22  Resp Panel by RT-PCR (Flu A&B, Covid) Anterior Nasal Swab   Collection Time: 08/04/22 12:50 PM   Specimen: Anterior Nasal Swab  Result Value Ref Range   SARS Coronavirus 2 by RT PCR NEGATIVE NEGATIVE   Influenza A by PCR NEGATIVE NEGATIVE   Influenza B by PCR POSITIVE (A) NEGATIVE       Pertinent labs & imaging results that were available during my care of the patient were reviewed by me and considered in my medical decision making.  Assessment & Plan:  Adrian Chandler" was seen today for ear pain.  Diagnoses and all orders for this visit:  Acute diffuse otitis externa of left ear -     ciprofloxacin-dexamethasone (CIPRODEX) OTIC suspension;  Place 4 drops into the left ear 2 (two) times daily for 7 days.     Assessment and Plan    Otitis Externa Acute left ear infection with symptoms starting two days ago, including congestion, sneezing, scratchy throat, and sensation of water in the ear. Pain radiates to teeth and neck. 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 Physical exam reveals drainage and infection in the left ear canal. Discussed Ciprodex (antibiotic and steroid drop) for pain and infection. - Prescribe Ciprodex, 2 times daily for 7 days - Administer Toradol for inflammation and pain - Advise follow-up if symptoms worsen or persist after one week - Send prescription to [facility] - Refer to ENT if symptoms persist after one week.          Continue all other maintenance medications.  Follow up plan: Return if symptoms worsen or fail to improve.   Continue healthy lifestyle choices, including diet (rich in fruits, vegetables, and lean proteins, and low in salt and simple carbohydrates) and exercise (at least 30 minutes of moderate physical activity daily).   The above assessment and management plan was discussed with the patient. The patient verbalized understanding of and has agreed to the management plan. Patient is aware to call the clinic if they develop any new symptoms or if symptoms persist or worsen. Patient is aware when to return to the clinic for a follow-up visit. Patient educated on when it is appropriate to go to the emergency department.   Kari Baars, FNP-C [facility] [phone]

## 2023-10-15 NOTE — Telephone Encounter (Signed)
Copied from CRM [id number]. Topic: Clinical - Medical Advice >> Oct 15, 2023  4:20 PM Adrian Chandler wrote: Reason for CRM: Patient was seen in the office today for his ears. Got the drop and a shot, but did not request an antibiotic be sent to the pharmacy as well. Asking if something can be sent today before the office closes?

## 2023-10-15 NOTE — Telephone Encounter (Signed)
Copied from CRM [id number]. Topic: General - Other >> Oct 15, 2023  3:57 PM Whitney O wrote: Reason for CRM: patient has a friend calling and she is asking why didn't they give him antibiotics . They just gave him ear drops patient friend brittany is needing to speak with doctor . All doctor did was give him ear drops and some type of shot . The call dropped when she was trying to get patient on the line to get the ok from him because she is not on the HIPAA please give patient a call concerning

## 2023-10-19 ENCOUNTER — Ambulatory Visit: Payer: Self-pay | Admitting: Family Medicine

## 2023-10-19 NOTE — Telephone Encounter (Signed)
 APPT MADE

## 2023-10-19 NOTE — Telephone Encounter (Signed)
  Chief Complaint: Ear pain Symptoms: pain to left ear Frequency: was seen on the 30th for same symptoms Pertinent Negatives: Patient denies fever Disposition: [] ED /[] Urgent Care (no appt availability in office) / [x] Appointment(In office/virtual)/ []  [facility]/ [] Home Care/ [] Refused Recommended Disposition /[] [facility]/ []  Follow-up with PCP Additional Notes: patient with hx of ear infections c/o of continued pain from left ear. Patient was seen on 1/30 and given drops which were to last for seven days. Patient states he had finished the drops today and nothing helped. Patient is requesting to be seen again. Per protocol, recommendation is for an appointment. Appointment made for 10/20/2023 at 3:35 pm. Patient verbalized understanding and all questions answered.    Copied from CRM [id number]. Topic: Clinical - Red Word Triage >> Oct 19, 2023  2:26 PM Alcus Dad H wrote: Red Word that prompted transfer to Nurse Triage: patient was recently seen for possible ear infection but the pain is not going away, still feels like there's water or something in his ear and described the pain as "feels like he's being stabbed" Reason for Disposition  [1] Taking antibiotic > 72 hours (3 days) and [2] pain persists or recurs  Answer Assessment - Initial Assessment Questions 1. ANTIBIOTIC: "What antibiotic are you taking?" "How many times per day?"    Ciprodex 2. ONSET: "When was the antibiotic started?"     Given 10/15/2023 3. LOCATION: "Which ear is involved?"     Left ear 4. PAIN: "How bad is the pain?"   (Scale 1-10; mild, moderate or severe)   - MILD (1-3): doesn't interfere with normal activities    - MODERATE (4-7): interferes with normal activities or awakens from sleep    - SEVERE (8-10): excruciating pain, unable to do any normal activities       10/10 5. FEVER: "Do you have a fever?" If Yes, ask: "What is your temperature, how was it measured, and when did it start?"     Low  grade fever 6. DISCHARGE: "Is there any discharge from the ear?"     no 7. OTHER SYMPTOMS: "Do you have any other symptoms?" (e.g., headache, stiff neck, dizziness, vomiting, runny nose)     congested  Protocols used: Ear - Otitis Media Follow-up Call-A-AH

## 2023-10-20 ENCOUNTER — Ambulatory Visit: Payer: Commercial Managed Care - PPO | Admitting: Nurse Practitioner

## 2023-10-20 ENCOUNTER — Encounter: Payer: Self-pay | Admitting: Nurse Practitioner

## 2023-10-20 VITALS — BP 106/71 | HR 55 | Temp 98.2°F | Ht 69.0 in | Wt 147.0 lb

## 2023-10-20 DIAGNOSIS — H60392 Other infective otitis externa, left ear: Secondary | ICD-10-CM

## 2023-10-20 MED ORDER — NEOMYCIN-POLYMYXIN-HC 3.5-10000-1 OT SOLN: 4.0000 [drp] | Freq: Four times a day (QID) | OTIC | 0 refills | Status: DC

## 2023-10-20 MED ORDER — AMOXICILLIN-POT CLAVULANATE 875-125 MG PO TABS: 1.0000 | ORAL_TABLET | Freq: Two times a day (BID) | ORAL | 0 refills | Status: AC

## 2023-10-20 NOTE — Progress Notes (Signed)
   Subjective:    Patient ID: Adrian Chandler, male    DOB: June 09, 1986, 38 y.o.   MRN: [id number]   Chief Complaint: Ear Pain (Left ear   Seen last week and given drops. Used all and still no better)   HPI  Patient Active Problem List   Diagnosis Date Noted   Recurrent oral herpes simplex 09/28/2023   Gastroesophageal reflux disease 12/06/2021   Recurrent genital HSV (herpes simplex virus) infection 12/06/2021   Substance abuse (HCC) 12/06/2021   Tobacco abuse 12/06/2021   Tinnitus, bilateral 03/26/2021   Bilateral hearing loss 03/26/2021    Patient in c/o ear pain. Was seen by M. Rakes, NP last week and was treated for ear infection withn ear drops. Ear is no better. Rates pain 5/10 currently. No drainage.    Review of Systems  Constitutional:  Negative for chills and fever.  HENT:  Positive for congestion and ear pain. Negative for ear discharge.        Objective:   Physical Exam Constitutional:      Appearance: Normal appearance.  HENT:     Right Ear: Tympanic membrane normal.     Ears:     Comments: Unabnle to see TM on left due to excudate in ear and small ear canals. Ear is painful when moving auricle. Neurological:     Mental Status: He is alert.     BP 106/71   Pulse (!) 55   Temp 98.2 F (36.8 C) (Temporal)   Ht 5' 9 (1.753 m)   Wt 147 lb (66.7 kg)   SpO2 98%   BMI 21.71 kg/m        Assessment & Plan:   Dorn Ludie Macleod in today with chief complaint of Ear Pain (Left ear   Seen last week and given drops. Used all and still no better)   1. Other infective acute otitis externa of left ear (Primary) Avoid getting water in ear Do not use q tips in ear RTO prn - neomycin -polymyxin-hydrocortisone (CORTISPORIN ) OTIC solution; Place 4 drops into the left ear 4 (four) times daily.  Dispense: 10 mL; Refill: 0 - amoxicillin -clavulanate (AUGMENTIN ) 875-125 MG tablet; Take 1 tablet by mouth 2 (two) times daily.  Dispense: 14 tablet; Refill:  0 - Ambulatory referral to ENT    The above assessment and management plan was discussed with the patient. The patient verbalized understanding of and has agreed to the management plan. Patient is aware to call the clinic if symptoms persist or worsen. Patient is aware when to return to the clinic for a follow-up visit. Patient educated on when it is appropriate to go to the emergency department.   Mary-Margaret Gladis, FNP

## 2023-10-20 NOTE — Patient Instructions (Signed)
 Otitis Externa  Otitis externa is an infection of the outer ear canal. The outer ear canal is the area between the outside of the ear and the eardrum. Otitis externa is sometimes called swimmer's ear. What are the causes? Common causes of this condition include: Swimming in dirty water. Moisture in the ear. An injury to the inside of the ear. An object stuck in the ear. A cut or scrape on the outside of the ear or in the ear canal. What increases the risk? You are more likely to develop this condition if you go swimming often. What are the signs or symptoms? The first symptom of this condition is often itching in the ear. Later symptoms of the condition include: Swelling of the ear. Redness in the ear. Ear pain. The pain may get worse when you pull on your ear. Pus coming from the ear. How is this diagnosed? This condition may be diagnosed by examining the ear and testing fluid from the ear for bacteria and funguses. How is this treated? This condition may be treated with: Antibiotic ear drops. These are often given for 10-14 days. Medicines to reduce itching and swelling. Follow these instructions at home: If you were prescribed antibiotic ear drops, use them as told by your health care provider. Do not stop using the antibiotic even if you start to feel better. Take over-the-counter and prescription medicines only as told by your health care provider. Avoid getting water in your ears as told by your health care provider. This may include avoiding swimming or water sports for a few days. Keep all follow-up visits. This is important. How is this prevented? Keep your ears dry. Use the corner of a towel to dry your ears after you swim or bathe. Avoid scratching or putting things in your ear. Doing these things can damage the ear canal or remove the protective wax that lines it, which makes it easier for bacteria and funguses to grow. Avoid swimming in lakes, polluted water, or swimming  pools that may not have enough chlorine. Contact a health care provider if: You have a fever. Your ear is still red, swollen, painful, or draining pus after 3 days. Your redness, swelling, or pain gets worse. You have a severe headache. Get help right away if: You have redness, swelling, and pain or tenderness in the area behind your ear. Summary Otitis externa is an infection of the outer ear canal. Common causes include swimming in dirty water, moisture in the ear, or a cut or scrape in the ear. Symptoms include pain, redness, and swelling of the ear canal. If you were prescribed antibiotic ear drops, use them as told by your health care provider. Do not stop using the antibiotic even if you start to feel better. This information is not intended to replace advice given to you by your health care provider. Make sure you discuss any questions you have with your health care provider. Document Revised: 11/14/2020 Document Reviewed: 11/14/2020 Elsevier Patient Education  2024 [employer].

## 2023-10-22 ENCOUNTER — Telehealth: Payer: Self-pay | Admitting: Family Medicine

## 2023-10-22 NOTE — Telephone Encounter (Signed)
 FYI  Copied from CRM [id number]. Topic: Referral - Status >> Oct 22, 2023  3:35 PM Powell HERO wrote: Reason for CRM: Please call the patient and advise on when he can schedule with ENT office, he wants to know the name of the provider he was referred to, he states he is in severe pain with his ear and needs to be seen asap by Ent. Offered to let talk to triage in regard to pain but refuses triage.

## 2023-10-23 NOTE — Telephone Encounter (Signed)
 Referral not picked up by any location. Patient notified. LS

## 2023-12-09 ENCOUNTER — Institutional Professional Consult (permissible substitution) (INDEPENDENT_AMBULATORY_CARE_PROVIDER_SITE_OTHER): Payer: Self-pay | Admitting: Otolaryngology

## 2024-03-01 ENCOUNTER — Telehealth: Payer: Self-pay | Admitting: Family Medicine

## 2024-03-01 DIAGNOSIS — B002 Herpesviral gingivostomatitis and pharyngotonsillitis: Secondary | ICD-10-CM

## 2024-03-01 DIAGNOSIS — A6000 Herpesviral infection of urogenital system, unspecified: Secondary | ICD-10-CM

## 2024-03-01 NOTE — Telephone Encounter (Signed)
 Copied from CRM [id number]. Topic: Clinical - Medication Refill >> Mar 01, 2024 11:12 AM Essie A wrote: Medication:   valACYclovir  (VALTREX ) 500 MG tablet  Has the patient contacted their pharmacy? Yes (Agent: If no, request that the patient contact the pharmacy for the refill. If patient does not wish to contact the pharmacy document the reason why and proceed with request.) (Agent: If yes, when and what did the pharmacy advise?)  This is the patient's preferred pharmacy:  [facility] [phone] - [place], [place] - [address] AT [facility] [address] [place] [address] Phone: [phone] Fax: [phone]  Is this the correct pharmacy for this prescription? Yes If no, delete pharmacy and type the correct one.   Has the prescription been filled recently? Yes  Is the patient out of the medication? No, have 3-4 left  Has the patient been seen for an appointment in the last year OR does the patient have an upcoming appointment? Yes  Can we respond through MyChart? No  Agent: Please be advised that Rx refills may take up to 3 business days. We ask that you follow-up with your pharmacy.

## 2024-03-02 MED ORDER — VALACYCLOVIR HCL 500 MG PO TABS: 500.0000 mg | ORAL_TABLET | Freq: Every day | ORAL | 0 refills | Status: DC

## 2024-03-30 ENCOUNTER — Encounter: Payer: Commercial Managed Care - PPO | Admitting: Family Medicine

## 2024-08-03 ENCOUNTER — Encounter: Payer: Self-pay | Admitting: Family Medicine

## 2024-08-03 ENCOUNTER — Ambulatory Visit: Admitting: Family Medicine

## 2024-08-03 VITALS — BP 107/65 | HR 65 | Temp 98.1°F | Ht 69.0 in | Wt 142.0 lb

## 2024-08-03 DIAGNOSIS — N451 Epididymitis: Secondary | ICD-10-CM

## 2024-08-03 DIAGNOSIS — H60392 Other infective otitis externa, left ear: Secondary | ICD-10-CM

## 2024-08-03 DIAGNOSIS — H6504 Acute serous otitis media, recurrent, right ear: Secondary | ICD-10-CM

## 2024-08-03 DIAGNOSIS — A6000 Herpesviral infection of urogenital system, unspecified: Secondary | ICD-10-CM | POA: Diagnosis not present

## 2024-08-03 DIAGNOSIS — H60391 Other infective otitis externa, right ear: Secondary | ICD-10-CM

## 2024-08-03 MED ORDER — CIPROFLOXACIN HCL 500 MG PO TABS: 500.0000 mg | ORAL_TABLET | Freq: Two times a day (BID) | ORAL | 0 refills | Status: AC

## 2024-08-03 MED ORDER — VALACYCLOVIR HCL 500 MG PO TABS: 500.0000 mg | ORAL_TABLET | Freq: Every day | ORAL | 3 refills | Status: AC

## 2024-08-03 MED ORDER — NEOMYCIN-POLYMYXIN-HC 3.5-10000-1 OT SOLN: 4.0000 [drp] | Freq: Four times a day (QID) | OTIC | 0 refills | Status: AC

## 2024-08-03 NOTE — Progress Notes (Signed)
 Subjective:  Patient ID: Adrian Chandler, male    DOB: May 10, 1986  Age: 38 y.o. MRN: [id number]  CC: Testicle Pain (Off and on dull pain when sitting. Right side close to a cyst. Cyst has been there for 20 years or more. Does not hurt. ) and Ear Pain (Right ear pain )   HPI  Discussed the use of AI scribe software for clinical note transcription with the patient, who gave verbal consent to proceed.  History of Present Illness Adrian Chandler is a 38 year old male who presents with right ear pain and epididymal discomfort.  He has been experiencing right ear pain for the past week, which he attributes to congestion and his history of bilateral eardrum rupture during [place]. There is constant tinnitus, and his ears are prone to swelling and pain, particularly the right ear, which currently hurts when he lays on it. He typically seeks medical attention when the pain becomes unbearable, although it is not at that level currently. He has used medicated drops in the past for relief.  He reports a dull, non-stabbing pain above his right testicle for the past three to four days. The pain is described as a 'dull tightness' that does not hurt to touch or during physical activities such as walking or sexual intercourse. He reports that he was told about a cyst in the area as a child, which has not been bothersome until recently. He is concerned about testicular cancer.  He has a history of herpes and takes daily medication for suppression. His medication is typically prescribed for 30 days at a time, which he finds insufficient for his needs. He has been tested for STDs within the past year and reports no recent outbreaks.  He does not take flu shots or COVID-19 vaccines, preferring natural remedies and vitamins. He works in holiday representative, which involves physical labor, but reports no interference with his work due to his current symptoms.          08/03/2024    4:15 PM  09/28/2023    3:52 PM 03/31/2022    8:59 AM  Depression screen PHQ 2/9  Decreased Interest 0 0 0  Down, Depressed, Hopeless 0 0 0  PHQ - 2 Score 0 0 0  Altered sleeping 0 0 0  Tired, decreased energy 0 0 0  Change in appetite 0 0 0  Feeling bad or failure about yourself  0 0 0  Trouble concentrating 0 0 0  Moving slowly or fidgety/restless 0 0 0  Suicidal thoughts 0 0 0  PHQ-9 Score 0 0  0   Difficult doing work/chores Not difficult at all Not difficult at all      Data saved with a previous flowsheet row definition    History Adrian Chandler has a past medical history of Childhood asthma, Ruptured tympanic membrane, and Substance abuse (HCC).   He has a past surgical history that includes Tonsillectomy.   His family history includes Alcohol abuse in his father; Anxiety disorder in his mother; Arthritis in his mother; Asthma in his mother; Depression in his mother; Drug abuse in his father; Hyperlipidemia in his mother; Hypertension in his mother.He reports that he has been smoking cigarettes. He has a 10.5 pack-year smoking history. He has never used smokeless tobacco. He reports current alcohol use of about 21.0 standard drinks of alcohol per week. He reports current drug use. Frequency: 7.00 times per week. Drug: Marijuana.    ROS Review of Systems  Constitutional:  Negative for fever.  Respiratory:  Negative for shortness of breath.   Cardiovascular:  Negative for chest pain.  Genitourinary:  Positive for dysuria and testicular pain. Negative for flank pain, frequency and genital sores.  Musculoskeletal:  Negative for arthralgias.  Skin:  Negative for rash.    Objective:  BP 107/65   Pulse 65   Temp 98.1 F (36.7 C)   Ht 5' 9 (1.753 m)   Wt 142 lb (64.4 kg)   SpO2 97%   BMI 20.97 kg/m   BP Readings from Last 3 Encounters:  08/03/24 107/65  10/20/23 106/71  10/15/23 96/60    Wt Readings from Last 3 Encounters:  08/03/24 142 lb (64.4 kg)  10/20/23 147 lb (66.7 kg)   10/15/23 147 lb (66.7 kg)     Physical Exam Physical Exam GENERAL: Alert, cooperative, well developed, no acute distress. HEENT: Normocephalic, normal oropharynx, moist mucous membranes. CHEST: Clear to auscultation bilaterally, no wheezes, rhonchi, or crackles. CARDIOVASCULAR: Normal heart rate and rhythm, S1 and S2 normal without murmurs. ABDOMEN: Soft, non-tender, non-distended, without organomegaly, normal bowel sounds. GENITOURINARY: Right testicle moderately larger than left, swelling in the right epididymis. EXTREMITIES: No cyanosis or edema. NEUROLOGICAL: Cranial nerves grossly intact, moves all extremities without gross motor or sensory deficit.   Assessment & Plan:  Epididymitis  Recurrent genital HSV (herpes simplex virus) infection -     valACYclovir  HCl; Take 1 tablet (500 mg total) by mouth daily.  Dispense: 90 tablet; Refill: 3  Recurrent acute serous otitis media of right ear  Other infective acute otitis externa of right ear  Other infective acute otitis externa of left ear -     Neomycin -Polymyxin-HC; Place 4 drops into the left ear 4 (four) times daily.  Dispense: 10 mL; Refill: 0  Other orders -     Ciprofloxacin  HCl; Take 1 tablet (500 mg total) by mouth 2 (two) times daily. Take all of these.  Dispense: 30 tablet; Refill: 0    Assessment and Plan Assessment & Plan Right epididymitis   He experiences a dull, tightness-like pain above the right testicle without sharp or stabbing sensations. There is no burning on urination or pain during intercourse. Examination shows swelling in the epididymis, likely due to a bacterial infection, with no signs of testicular cancer. Prescribe Ciprofloxacin  twice daily for two weeks. Monitor for diarrhea; if moderate to severe, stop medication and contact the provider. Consider ultrasound if symptoms persist beyond 10-14 days to rule out other causes.  Genital herpes simplex virus infection (suppression therapy)   He is  on daily suppression therapy for genital herpes with no active outbreak reported. Emphasize the importance of maintaining suppression therapy to prevent outbreaks and transmission. Prescribe a 90-day supply of antiviral medication with three refills for a year.  Right ear pain and tinnitus   He has chronic tinnitus and intermittent ear pain, worsened by lying on the affected side, with a history of eardrum damage from [place]. Examination suggests possible bacterial involvement. Prescribe Cortisporin  ear drops to be used four times a day. Ciprofloxacin  for epididymitis may also alleviate ear symptoms.       Follow-up: Return if symptoms worsen or fail to improve.  Adrian Chandler, M.D.
